# Patient Record
Sex: Female | Born: 1935 | Race: Black or African American | Hispanic: No | Marital: Married | Smoking: Never smoker
Health system: Southern US, Community
[De-identification: ages and names within clinical notes are randomized; demographics above are authoritative.]

## PROBLEM LIST (undated history)

## (undated) DIAGNOSIS — F419 Anxiety disorder, unspecified: Secondary | ICD-10-CM

## (undated) DIAGNOSIS — E7800 Pure hypercholesterolemia, unspecified: Secondary | ICD-10-CM

## (undated) DIAGNOSIS — M109 Gout, unspecified: Secondary | ICD-10-CM

## (undated) DIAGNOSIS — N189 Chronic kidney disease, unspecified: Secondary | ICD-10-CM

## (undated) DIAGNOSIS — I2510 Atherosclerotic heart disease of native coronary artery without angina pectoris: Secondary | ICD-10-CM

## (undated) DIAGNOSIS — C50912 Malignant neoplasm of unspecified site of left female breast: Secondary | ICD-10-CM

## (undated) DIAGNOSIS — E039 Hypothyroidism, unspecified: Secondary | ICD-10-CM

## (undated) DIAGNOSIS — I219 Acute myocardial infarction, unspecified: Secondary | ICD-10-CM

## (undated) DIAGNOSIS — Z9289 Personal history of other medical treatment: Secondary | ICD-10-CM

## (undated) DIAGNOSIS — M1990 Unspecified osteoarthritis, unspecified site: Secondary | ICD-10-CM

## (undated) DIAGNOSIS — I10 Essential (primary) hypertension: Secondary | ICD-10-CM

## (undated) DIAGNOSIS — A159 Respiratory tuberculosis unspecified: Secondary | ICD-10-CM

## (undated) DIAGNOSIS — I82409 Acute embolism and thrombosis of unspecified deep veins of unspecified lower extremity: Secondary | ICD-10-CM

## (undated) HISTORY — PX: ABDOMINAL HYSTERECTOMY: SHX81

## (undated) HISTORY — PX: CATARACT EXTRACTION, BILATERAL: SHX1313

## (undated) HISTORY — PX: BREAST BIOPSY: SHX20

## (undated) HISTORY — PX: LUMBAR DISC SURGERY: SHX700

---

## 2016-06-03 DIAGNOSIS — I219 Acute myocardial infarction, unspecified: Secondary | ICD-10-CM

## 2016-06-03 HISTORY — PX: CORONARY ANGIOPLASTY WITH STENT PLACEMENT: SHX49

## 2016-06-03 HISTORY — DX: Acute myocardial infarction, unspecified: I21.9

## 2016-08-04 DIAGNOSIS — I82409 Acute embolism and thrombosis of unspecified deep veins of unspecified lower extremity: Secondary | ICD-10-CM

## 2016-08-04 HISTORY — DX: Acute embolism and thrombosis of unspecified deep veins of unspecified lower extremity: I82.409

## 2016-09-01 ENCOUNTER — Observation Stay (HOSPITAL_COMMUNITY): Payer: Medicare Other

## 2016-09-01 ENCOUNTER — Other Ambulatory Visit (HOSPITAL_COMMUNITY): Payer: Self-pay

## 2016-09-01 ENCOUNTER — Inpatient Hospital Stay (HOSPITAL_COMMUNITY)
Admission: EM | Admit: 2016-09-01 | Discharge: 2016-09-05 | DRG: 644 | Disposition: A | Discharge status: Home / Self Care | Payer: Medicare Other | Attending: Oncology | Admitting: Oncology

## 2016-09-01 ENCOUNTER — Encounter (HOSPITAL_COMMUNITY): Payer: Self-pay

## 2016-09-01 DIAGNOSIS — Z86718 Personal history of other venous thrombosis and embolism: Secondary | ICD-10-CM

## 2016-09-01 DIAGNOSIS — I240 Acute coronary thrombosis not resulting in myocardial infarction: Secondary | ICD-10-CM | POA: Diagnosis present

## 2016-09-01 DIAGNOSIS — N179 Acute kidney failure, unspecified: Secondary | ICD-10-CM | POA: Diagnosis present

## 2016-09-01 DIAGNOSIS — M533 Sacrococcygeal disorders, not elsewhere classified: Secondary | ICD-10-CM | POA: Diagnosis present

## 2016-09-01 DIAGNOSIS — R531 Weakness: Secondary | ICD-10-CM

## 2016-09-01 DIAGNOSIS — Z9114 Patient's other noncompliance with medication regimen: Secondary | ICD-10-CM

## 2016-09-01 DIAGNOSIS — I440 Atrioventricular block, first degree: Secondary | ICD-10-CM | POA: Diagnosis present

## 2016-09-01 DIAGNOSIS — N185 Chronic kidney disease, stage 5: Secondary | ICD-10-CM | POA: Diagnosis present

## 2016-09-01 DIAGNOSIS — I2510 Atherosclerotic heart disease of native coronary artery without angina pectoris: Secondary | ICD-10-CM | POA: Diagnosis present

## 2016-09-01 DIAGNOSIS — I252 Old myocardial infarction: Secondary | ICD-10-CM

## 2016-09-01 DIAGNOSIS — Z638 Other specified problems related to primary support group: Secondary | ICD-10-CM

## 2016-09-01 DIAGNOSIS — I700 Atherosclerosis of aorta: Secondary | ICD-10-CM

## 2016-09-01 DIAGNOSIS — I480 Paroxysmal atrial fibrillation: Secondary | ICD-10-CM

## 2016-09-01 DIAGNOSIS — I82402 Acute embolism and thrombosis of unspecified deep veins of left lower extremity: Secondary | ICD-10-CM | POA: Diagnosis present

## 2016-09-01 DIAGNOSIS — R9431 Abnormal electrocardiogram [ECG] [EKG]: Secondary | ICD-10-CM | POA: Diagnosis not present

## 2016-09-01 DIAGNOSIS — I120 Hypertensive chronic kidney disease with stage 5 chronic kidney disease or end stage renal disease: Secondary | ICD-10-CM | POA: Diagnosis present

## 2016-09-01 DIAGNOSIS — Z955 Presence of coronary angioplasty implant and graft: Secondary | ICD-10-CM

## 2016-09-01 DIAGNOSIS — I4581 Long QT syndrome: Secondary | ICD-10-CM | POA: Diagnosis present

## 2016-09-01 DIAGNOSIS — D72829 Elevated white blood cell count, unspecified: Secondary | ICD-10-CM | POA: Diagnosis present

## 2016-09-01 DIAGNOSIS — R778 Other specified abnormalities of plasma proteins: Secondary | ICD-10-CM | POA: Diagnosis not present

## 2016-09-01 DIAGNOSIS — R7989 Other specified abnormal findings of blood chemistry: Secondary | ICD-10-CM | POA: Diagnosis not present

## 2016-09-01 DIAGNOSIS — R4182 Altered mental status, unspecified: Secondary | ICD-10-CM | POA: Diagnosis not present

## 2016-09-01 DIAGNOSIS — E039 Hypothyroidism, unspecified: Secondary | ICD-10-CM | POA: Diagnosis not present

## 2016-09-01 DIAGNOSIS — D638 Anemia in other chronic diseases classified elsewhere: Secondary | ICD-10-CM

## 2016-09-01 DIAGNOSIS — Z23 Encounter for immunization: Secondary | ICD-10-CM

## 2016-09-01 DIAGNOSIS — T462X5A Adverse effect of other antidysrhythmic drugs, initial encounter: Secondary | ICD-10-CM | POA: Diagnosis present

## 2016-09-01 DIAGNOSIS — D631 Anemia in chronic kidney disease: Secondary | ICD-10-CM | POA: Diagnosis present

## 2016-09-01 DIAGNOSIS — I82439 Acute embolism and thrombosis of unspecified popliteal vein: Secondary | ICD-10-CM | POA: Diagnosis present

## 2016-09-01 DIAGNOSIS — D649 Anemia, unspecified: Secondary | ICD-10-CM

## 2016-09-01 DIAGNOSIS — Z79899 Other long term (current) drug therapy: Secondary | ICD-10-CM

## 2016-09-01 DIAGNOSIS — Z7901 Long term (current) use of anticoagulants: Secondary | ICD-10-CM

## 2016-09-01 DIAGNOSIS — R410 Disorientation, unspecified: Secondary | ICD-10-CM | POA: Diagnosis present

## 2016-09-01 DIAGNOSIS — Z8611 Personal history of tuberculosis: Secondary | ICD-10-CM

## 2016-09-01 DIAGNOSIS — H18419 Arcus senilis, unspecified eye: Secondary | ICD-10-CM | POA: Diagnosis present

## 2016-09-01 DIAGNOSIS — I82409 Acute embolism and thrombosis of unspecified deep veins of unspecified lower extremity: Secondary | ICD-10-CM

## 2016-09-01 HISTORY — DX: Atherosclerotic heart disease of native coronary artery without angina pectoris: I25.10

## 2016-09-01 HISTORY — DX: Gout, unspecified: M10.9

## 2016-09-01 HISTORY — DX: Personal history of other medical treatment: Z92.89

## 2016-09-01 HISTORY — DX: Pure hypercholesterolemia, unspecified: E78.00

## 2016-09-01 HISTORY — DX: Malignant neoplasm of unspecified site of left female breast: C50.912

## 2016-09-01 HISTORY — DX: Hypothyroidism, unspecified: E03.9

## 2016-09-01 HISTORY — DX: Respiratory tuberculosis unspecified: A15.9

## 2016-09-01 HISTORY — DX: Unspecified osteoarthritis, unspecified site: M19.90

## 2016-09-01 HISTORY — DX: Essential (primary) hypertension: I10

## 2016-09-01 HISTORY — DX: Acute embolism and thrombosis of unspecified deep veins of unspecified lower extremity: I82.409

## 2016-09-01 HISTORY — DX: Acute myocardial infarction, unspecified: I21.9

## 2016-09-01 HISTORY — DX: Anxiety disorder, unspecified: F41.9

## 2016-09-01 HISTORY — DX: Chronic kidney disease, unspecified: N18.9

## 2016-09-01 LAB — CBG MONITORING, ED: Glucose-Capillary: 99 mg/dL (ref 65–99)

## 2016-09-01 LAB — URINE MICROSCOPIC-ADD ON

## 2016-09-01 LAB — URINALYSIS, ROUTINE W REFLEX MICROSCOPIC
BILIRUBIN URINE: NEGATIVE
Glucose, UA: NEGATIVE mg/dL
Ketones, ur: NEGATIVE mg/dL
NITRITE: NEGATIVE
PH: 7.5 (ref 5.0–8.0)
Protein, ur: 30 mg/dL — AB
SPECIFIC GRAVITY, URINE: 1.016 (ref 1.005–1.030)

## 2016-09-01 LAB — BASIC METABOLIC PANEL
ANION GAP: 8 (ref 5–15)
BUN: 26 mg/dL — ABNORMAL HIGH (ref 6–20)
CHLORIDE: 107 mmol/L (ref 101–111)
CO2: 26 mmol/L (ref 22–32)
CREATININE: 3.15 mg/dL — AB (ref 0.44–1.00)
Calcium: 9.1 mg/dL (ref 8.9–10.3)
GFR calc non Af Amer: 12 mL/min — ABNORMAL LOW (ref >60)
GFR, EST AFRICAN AMERICAN: 14 mL/min — AB (ref >60)
Glucose, Bld: 92 mg/dL (ref 65–99)
POTASSIUM: 3.8 mmol/L (ref 3.5–5.1)
SODIUM: 141 mmol/L (ref 135–145)

## 2016-09-01 LAB — TROPONIN I
TROPONIN I: 0.31 ng/mL — AB (ref <0.03)
Troponin I: 0.28 ng/mL (ref <0.03)
Troponin I: 0.26 ng/mL (ref <0.03)

## 2016-09-01 LAB — HEPATIC FUNCTION PANEL
ALT: 34 U/L (ref 14–54)
AST: 67 U/L — ABNORMAL HIGH (ref 15–41)
Albumin: 2.7 g/dL — ABNORMAL LOW (ref 3.5–5.0)
Alkaline Phosphatase: 101 U/L (ref 38–126)
BILIRUBIN DIRECT: 0.3 mg/dL (ref 0.1–0.5)
BILIRUBIN INDIRECT: 0.5 mg/dL (ref 0.3–0.9)
Total Bilirubin: 0.8 mg/dL (ref 0.3–1.2)
Total Protein: 5.5 g/dL — ABNORMAL LOW (ref 6.5–8.1)

## 2016-09-01 LAB — CBC WITH DIFFERENTIAL/PLATELET
BASOS PCT: 0 %
Basophils Absolute: 0 10*3/uL (ref 0.0–0.1)
EOS ABS: 0.3 10*3/uL (ref 0.0–0.7)
Eosinophils Relative: 3 %
HEMATOCRIT: 27.9 % — AB (ref 36.0–46.0)
HEMOGLOBIN: 9.1 g/dL — AB (ref 12.0–15.0)
Lymphocytes Relative: 24 %
Lymphs Abs: 2.3 10*3/uL (ref 0.7–4.0)
MCH: 29.9 pg (ref 26.0–34.0)
MCHC: 32.6 g/dL (ref 30.0–36.0)
MCV: 91.8 fL (ref 78.0–100.0)
MONOS PCT: 5 %
Monocytes Absolute: 0.5 10*3/uL (ref 0.1–1.0)
NEUTROS ABS: 6.4 10*3/uL (ref 1.7–7.7)
NEUTROS PCT: 68 %
Platelets: 249 10*3/uL (ref 150–400)
RBC: 3.04 MIL/uL — AB (ref 3.87–5.11)
RDW: 17.9 % — ABNORMAL HIGH (ref 11.5–15.5)
WBC: 9.5 10*3/uL (ref 4.0–10.5)

## 2016-09-01 LAB — I-STAT TROPONIN, ED: TROPONIN I, POC: 0.31 ng/mL — AB (ref 0.00–0.08)

## 2016-09-01 LAB — MAGNESIUM: MAGNESIUM: 2 mg/dL (ref 1.7–2.4)

## 2016-09-01 LAB — BRAIN NATRIURETIC PEPTIDE: B NATRIURETIC PEPTIDE 5: 810.7 pg/mL — AB (ref 0.0–100.0)

## 2016-09-01 LAB — PROTIME-INR
INR: 1.4
Prothrombin Time: 17.3 seconds — ABNORMAL HIGH (ref 11.4–15.2)

## 2016-09-01 LAB — T4, FREE: FREE T4: 1.04 ng/dL (ref 0.61–1.12)

## 2016-09-01 LAB — HEPARIN LEVEL (UNFRACTIONATED): HEPARIN UNFRACTIONATED: 0.8 [IU]/mL — AB (ref 0.30–0.70)

## 2016-09-01 LAB — I-STAT CG4 LACTIC ACID, ED: Lactic Acid, Venous: 1.78 mmol/L (ref 0.5–1.9)

## 2016-09-01 LAB — TSH: TSH: 32.15 u[IU]/mL — AB (ref 0.350–4.500)

## 2016-09-01 MED ORDER — ASPIRIN EC 81 MG PO TBEC
81.0000 mg | DELAYED_RELEASE_TABLET | Freq: Every day | ORAL | Status: DC
  Administered 2016-09-02: 81 mg via ORAL
  Filled 2016-09-01: qty 1

## 2016-09-01 MED ORDER — ENSURE ENLIVE PO LIQD
237.0000 mL | Freq: Two times a day (BID) | ORAL | Status: DC
  Administered 2016-09-01 – 2016-09-05 (×6): 237 mL via ORAL

## 2016-09-01 MED ORDER — ACETAMINOPHEN 325 MG PO TABS
650.0000 mg | ORAL_TABLET | Freq: Four times a day (QID) | ORAL | Status: DC | PRN
  Administered 2016-09-02 – 2016-09-05 (×8): 650 mg via ORAL
  Filled 2016-09-01 (×8): qty 2

## 2016-09-01 MED ORDER — SODIUM CHLORIDE 0.9 % IV BOLUS (SEPSIS)
500.0000 mL | Freq: Once | INTRAVENOUS | Status: AC
  Administered 2016-09-01: 500 mL via INTRAVENOUS

## 2016-09-01 MED ORDER — CLOPIDOGREL BISULFATE 75 MG PO TABS
75.0000 mg | ORAL_TABLET | Freq: Every day | ORAL | Status: DC
  Administered 2016-09-01 – 2016-09-05 (×5): 75 mg via ORAL
  Filled 2016-09-01 (×5): qty 1

## 2016-09-01 MED ORDER — ACETAMINOPHEN 500 MG PO TABS
1000.0000 mg | ORAL_TABLET | Freq: Once | ORAL | Status: AC
  Administered 2016-09-01: 1000 mg via ORAL
  Filled 2016-09-01: qty 2

## 2016-09-01 MED ORDER — LEVOTHYROXINE SODIUM 25 MCG PO TABS: 25.0000 ug | ORAL_TABLET | Freq: Every day | ORAL | Status: DC

## 2016-09-01 MED ORDER — SODIUM CHLORIDE 0.9 % IV SOLN
INTRAVENOUS | Status: AC
  Administered 2016-09-01 – 2016-09-02 (×2): via INTRAVENOUS

## 2016-09-01 MED ORDER — LORAZEPAM 0.5 MG PO TABS
0.5000 mg | ORAL_TABLET | Freq: Every evening | ORAL | Status: DC | PRN
  Administered 2016-09-01 – 2016-09-03 (×3): 0.5 mg via ORAL
  Filled 2016-09-01 (×3): qty 1

## 2016-09-01 MED ORDER — LEVOTHYROXINE SODIUM 50 MCG PO TABS
50.0000 ug | ORAL_TABLET | Freq: Every day | ORAL | Status: DC
  Administered 2016-09-02 – 2016-09-05 (×4): 50 ug via ORAL
  Filled 2016-09-01: qty 1
  Filled 2016-09-01: qty 2
  Filled 2016-09-01 (×2): qty 1

## 2016-09-01 MED ORDER — INFLUENZA VAC SPLIT QUAD 0.5 ML IM SUSY
0.5000 mL | PREFILLED_SYRINGE | INTRAMUSCULAR | Status: AC
  Administered 2016-09-04: 0.5 mL via INTRAMUSCULAR

## 2016-09-01 MED ORDER — AMIODARONE HCL 200 MG PO TABS
200.0000 mg | ORAL_TABLET | Freq: Every day | ORAL | Status: DC
  Administered 2016-09-01 – 2016-09-03 (×3): 200 mg via ORAL
  Filled 2016-09-01 (×3): qty 1

## 2016-09-01 MED ORDER — LEVOTHYROXINE SODIUM 50 MCG PO TABS: 50.0000 ug | ORAL_TABLET | Freq: Every day | ORAL | Status: DC

## 2016-09-01 MED ORDER — ASPIRIN 81 MG PO CHEW
324.0000 mg | CHEWABLE_TABLET | Freq: Once | ORAL | Status: AC
  Administered 2016-09-01: 324 mg via ORAL
  Filled 2016-09-01: qty 4

## 2016-09-01 MED ORDER — ACETAMINOPHEN 325 MG PO TABS
650.0000 mg | ORAL_TABLET | Freq: Once | ORAL | Status: AC
  Administered 2016-09-01: 650 mg via ORAL
  Filled 2016-09-01: qty 2

## 2016-09-01 MED ORDER — SODIUM CHLORIDE 0.9% FLUSH
3.0000 mL | Freq: Two times a day (BID) | INTRAVENOUS | Status: DC
  Administered 2016-09-02 – 2016-09-04 (×4): 3 mL via INTRAVENOUS

## 2016-09-01 MED ORDER — HEPARIN (PORCINE) IN NACL 100-0.45 UNIT/ML-% IJ SOLN
650.0000 [IU]/h | INTRAMUSCULAR | Status: DC
  Administered 2016-09-01: 950 [IU]/h via INTRAVENOUS
  Filled 2016-09-01 (×2): qty 250

## 2016-09-01 MED ORDER — DILTIAZEM HCL 30 MG PO TABS
30.0000 mg | ORAL_TABLET | Freq: Three times a day (TID) | ORAL | Status: DC
  Administered 2016-09-01 – 2016-09-05 (×12): 30 mg via ORAL
  Filled 2016-09-01 (×12): qty 1
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
## 2016-09-01 NOTE — Progress Notes (Signed)
ANTICOAGULATION CONSULT NOTE - Initial Consult  Pharmacy Consult for heparin  Indication: DVT  No Known Allergies  Patient Measurements: Height: 5\' 6"  (167.6 cm) Weight: 125 lb (56.7 kg) IBW/kg (Calculated) : 59.3 Heparin Dosing Weight: 56.7  Vital Signs: Temp: 97.8 F (36.6 C) (09/29 0817) Temp Source: Oral (09/29 0817) BP: 115/78 (09/29 1047) Pulse Rate: 67 (09/29 1047)  Labs:  Recent Labs  09/01/16 0903  HGB 9.1*  HCT 27.9*  PLT 249  CREATININE 3.15*  TROPONINI 0.28*   Estimated Creatinine Clearance: 11.3 mL/min (by C-G formula based on SCr of 3.15 mg/dL (H)).  Medical History: Past Medical History:  Diagnosis Date  . DVT (deep venous thrombosis) (HCC)    left  . Hypertension   . MI (myocardial infarction) [facility])    Assessment: Dawn Watson presents to [facility] with bilateral lower extremity edema. Pharmacy has been consulted for heparin drip for concert of left lower leg DVT.   Goal of Therapy:  Heparin level 0.3-0.7 units/ml Monitor platelets by anticoagulation protocol: Yes   Plan:  Start heparin infusion at 950 units/hr  8 hour level Monitor s/sx of bleeding  Dawn Watson 09/01/2016,11:35 AM

## 2016-09-01 NOTE — ED Notes (Signed)
RN attempted IV 2x unsuccessfully.  

## 2016-09-01 NOTE — ED Notes (Signed)
Pt returned from X-ray.  

## 2016-09-01 NOTE — Progress Notes (Signed)
ANTICOAGULATION CONSULT NOTE - Follow Up Consult  Pharmacy Consult for Heparin  Indication: DVT  No Known Allergies  Patient Measurements: Height: 5\' 6"  (167.6 cm) Weight: 125 lb (56.7 kg) IBW/kg (Calculated) : 59.3  Vital Signs: Temp: 99.1 F (37.3 C) (09/29 2012) Temp Source: Oral (09/29 2012) BP: 139/68 (09/29 2012) Pulse Rate: 67 (09/29 2012)  Labs:  Recent Labs  09/01/16 0903 09/01/16 1513 09/01/16 2144  HGB 9.1*  --   --   HCT 27.9*  --   --   PLT 249  --   --   LABPROT  --  17.3*  --   INR  --  1.40  --   HEPARINUNFRC  --   --  0.80*  CREATININE 3.15*  --   --   TROPONINI 0.28* 0.26* 0.31*    Estimated Creatinine Clearance: 11.3 mL/min (by C-G formula based on SCr of 3.15 mg/dL (H)).  Assessment: Pt with recent DVT on warfarin, now with concern for PE, on heparin for now, looks like she was on warfarin with a sub-therapeutic INR of 1.4, heparin level tonight is slightly elevated at 0.8, no issues per RN.   Goal of Therapy:  Heparin level 0.3-0.7 units/ml Monitor platelets by anticoagulation protocol: Yes   Plan:  -Decrease heparin to 850 units/hr -HL with AM labs  [name] 09/01/2016,11:18 PM
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
## 2016-09-01 NOTE — ED Notes (Signed)
Attempted IV x2. 

## 2016-09-01 NOTE — ED Notes (Signed)
CBG- 99 

## 2016-09-01 NOTE — ED Notes (Signed)
Provided pt with beef broth to drink while she waits on breakfast tray

## 2016-09-01 NOTE — ED Provider Notes (Signed)
[facility] DEPT Provider Note   CSN: [id number] Arrival date & time:        History   Chief Complaint Chief Complaint  Patient presents with  . Weakness    HPI Dawn Watson is a 82 y.o. female.  The history is provided by the patient.  Weakness  Primary symptoms include memory loss (The patient is only oriented to self and the president). Primary symptoms comment: Patient states she does not want to go home because her niece does not want to feed people in her house and there is too much noise. Chronicity: unknown. Associated symptoms include altered mental status.  Altered Mental Status   Associated symptoms include weakness.    Past Medical History:  Diagnosis Date  . DVT (deep venous thrombosis) (HCC)    left  . Hypertension   . MI (myocardial infarction) ([place])     There are no active problems to display for this patient.   Past Surgical History:  Procedure Laterality Date  . CORONARY ANGIOPLASTY WITH STENT PLACEMENT      OB History    No data available       Home Medications    Prior to Admission medications   Not on File    Family History History reviewed. No pertinent family history.  Social History Social History  Substance Use Topics  . Smoking status: Never Smoker  . Smokeless tobacco: Never Used  . Alcohol use No     Allergies   Review of patient's allergies indicates no known allergies.   Review of Systems Review of Systems  Unable to perform ROS: Mental status change  Musculoskeletal: Positive for back pain.  Neurological: Positive for weakness.  Psychiatric/Behavioral: Positive for memory loss (The patient is only oriented to self and the president).  Level 5 exception to history: dementia  Physical Exam Updated Vital Signs BP 148/67 (BP Location: Right Arm)   Pulse 70   Temp 97.8 F (36.6 C) (Oral)   Resp 16   Ht 5\' 6"  (1.676 m)   Wt 125 lb (56.7 kg)   SpO2 100%   BMI 20.18 kg/m   Physical Exam    Constitutional: She appears well-developed and well-nourished. No distress.  HENT:  Head: Normocephalic and atraumatic.  Nose: Nose normal.  Eyes: Conjunctivae are normal.  Neck: Neck supple. No tracheal deviation present.  Cardiovascular: Normal rate, regular rhythm and normal heart sounds.   Pulmonary/Chest: Effort normal and breath sounds normal. No respiratory distress.  Abdominal: Soft. She exhibits no distension. There is no tenderness.  Neurological: She is alert. She has normal strength. She is disoriented (unclear what Pt's baseline is). No cranial nerve deficit. GCS eye subscore is 4. GCS verbal subscore is 4. GCS motor subscore is 6.  Skin: Skin is warm and dry. Capillary refill takes less than 2 seconds.  Psychiatric: She has a normal mood and affect.    ED Treatments / Results  Labs (all labs ordered are listed, but only abnormal results are displayed) Labs Reviewed  CBC WITH DIFFERENTIAL/PLATELET - Abnormal; Notable for the following:       Result Value   RBC 3.04 (*)    Hemoglobin 9.1 (*)    HCT 27.9 (*)    RDW 17.9 (*)    All other components within normal limits  BASIC METABOLIC PANEL - Abnormal; Notable for the following:    BUN 26 (*)    Creatinine, Ser 3.15 (*)    GFR calc non Af Amer 12 (*)  GFR calc Af Amer 14 (*)    All other components within normal limits  URINALYSIS, ROUTINE W REFLEX MICROSCOPIC (NOT AT [facility]) - Abnormal; Notable for the following:    Hgb urine dipstick MODERATE (*)    Protein, ur 30 (*)    Leukocytes, UA TRACE (*)    All other components within normal limits  URINE MICROSCOPIC-ADD ON - Abnormal; Notable for the following:    Squamous Epithelial / LPF 6-30 (*)    Bacteria, UA FEW (*)    All other components within normal limits  BRAIN NATRIURETIC PEPTIDE - Abnormal; Notable for the following:    B Natriuretic Peptide 810.7 (*)    All other components within normal limits  TSH - Abnormal; Notable for the following:    TSH 32.150  (*)    All other components within normal limits  HEPATIC FUNCTION PANEL - Abnormal; Notable for the following:    Total Protein 5.5 (*)    Albumin 2.7 (*)    AST 67 (*)    All other components within normal limits  I-STAT TROPOININ, ED - Abnormal; Notable for the following:    Troponin i, poc 0.31 (*)    All other components within normal limits  MAGNESIUM  TROPONIN I  TROPONIN I  TROPONIN I  CBG MONITORING, ED  I-STAT CG4 LACTIC ACID, ED    EKG  EKG Interpretation  Date/Time:  Friday September 01 2016 08:18:28 EDT Ventricular Rate:  69 PR Interval:    QRS Duration: 108 QT Interval:  534 QTC Calculation: 573 R Axis:   -66 Text Interpretation:  Sinus or ectopic atrial rhythm Prolonged PR interval Inferior infarct, age indeterminate Abnormal lateral Q waves Probable anterior infarct, recent Prolonged QT interval No previous tracing Confirmed by Harish Bram MD, Shermon Bozzi [phone]) on 09/01/2016 8:30:02 AM       Radiology Dg Chest 2 View  Result Date: 09/01/2016 CLINICAL DATA:  82 year old with weakness. History of myocardial infarction, hypertension and DVT. No relevant surgical history provided. EXAM: CHEST  2 VIEW COMPARISON:  None. FINDINGS: The heart is enlarged. There is a coronary artery stent and aortic atherosclerosis. There is suspected chronic lung disease with interstitial prominence, central airway thickening and volume loss in the left upper lobe. There is asymmetric left apical pleural and subpleural density, likely the sequela of prior granulomatous disease. Some of these findings could be postsurgical. The patient appears to be status post partial resection of the fifth ribs bilaterally. No acute osseous findings are seen. IMPRESSION: No suspected acute findings. Evaluation limited by the lack of prior studies. There are apparent postsurgical changes status post partial resection of the fifth ribs bilaterally. Cardiomegaly and probable sequela of granulomatous disease in the  left upper lobe. Correlate clinically. Electronically Signed   By: Richardean Sale M.D.   On: 09/01/2016 10:44    Procedures Procedures (including critical care time)  Medications Ordered in ED Medications  acetaminophen (TYLENOL) tablet 1,000 mg (1,000 mg Oral Given 09/01/16 0932)  aspirin chewable tablet 324 mg (324 mg Oral Given 09/01/16 0932)  sodium chloride 0.9 % bolus 500 mL (500 mLs Intravenous New Bag/Given 09/01/16 1012)     Initial Impression / Assessment and Plan / ED Course  I have reviewed the triage vital signs and the nursing notes.  Pertinent labs & imaging results that were available during my care of the patient were reviewed by me and considered in my medical decision making (see chart for details).  Clinical Course  82 y.o. female presents with EMS for unknown complaint. Patient has no medical history documented at this facility. Patient is noncontributory historian and is only oriented to self and who the president is. She is in no acute distress and has no complaints besides mild low back pain. She is unsure why she came to the emergency department and changes her story multiple times. Multiple lab abnormalities are concerning including signs of acute or chronic kidney disease, anemia, elevation of troponin, hypothyroid, and unable to determine the onset or any historical features.  Cardiology consulted and recommends no intervention. Internal medicine was consulted for admission and will see the patient in the emergency department.   Final Clinical Impressions(s) / ED Diagnoses   Final diagnoses:  Weakness  Troponin level elevated  Confusion  Hypothyroidism, unspecified hypothyroidism type  Elevated serum creatinine  Normocytic anemia    New Prescriptions New Prescriptions   No medications on file     Leo Grosser, MD 09/01/16 1104
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
## 2016-09-01 NOTE — ED Notes (Signed)
Admitting at bedside 

## 2016-09-01 NOTE — ED Notes (Signed)
Pt being taken to Xray by Geoffery Spruce

## 2016-09-02 ENCOUNTER — Observation Stay (HOSPITAL_COMMUNITY): Payer: Medicare Other

## 2016-09-02 DIAGNOSIS — Z955 Presence of coronary angioplasty implant and graft: Secondary | ICD-10-CM | POA: Diagnosis not present

## 2016-09-02 DIAGNOSIS — D631 Anemia in chronic kidney disease: Secondary | ICD-10-CM | POA: Diagnosis present

## 2016-09-02 DIAGNOSIS — R410 Disorientation, unspecified: Secondary | ICD-10-CM | POA: Diagnosis present

## 2016-09-02 DIAGNOSIS — H18419 Arcus senilis, unspecified eye: Secondary | ICD-10-CM | POA: Diagnosis present

## 2016-09-02 DIAGNOSIS — I252 Old myocardial infarction: Secondary | ICD-10-CM | POA: Diagnosis not present

## 2016-09-02 DIAGNOSIS — N185 Chronic kidney disease, stage 5: Secondary | ICD-10-CM | POA: Diagnosis present

## 2016-09-02 DIAGNOSIS — I4581 Long QT syndrome: Secondary | ICD-10-CM | POA: Diagnosis present

## 2016-09-02 DIAGNOSIS — R609 Edema, unspecified: Secondary | ICD-10-CM | POA: Diagnosis not present

## 2016-09-02 DIAGNOSIS — Z23 Encounter for immunization: Secondary | ICD-10-CM | POA: Diagnosis not present

## 2016-09-02 DIAGNOSIS — I480 Paroxysmal atrial fibrillation: Secondary | ICD-10-CM | POA: Diagnosis present

## 2016-09-02 DIAGNOSIS — T462X5A Adverse effect of other antidysrhythmic drugs, initial encounter: Secondary | ICD-10-CM | POA: Diagnosis present

## 2016-09-02 DIAGNOSIS — R079 Chest pain, unspecified: Secondary | ICD-10-CM | POA: Diagnosis not present

## 2016-09-02 DIAGNOSIS — R748 Abnormal levels of other serum enzymes: Secondary | ICD-10-CM | POA: Diagnosis not present

## 2016-09-02 DIAGNOSIS — I440 Atrioventricular block, first degree: Secondary | ICD-10-CM | POA: Diagnosis present

## 2016-09-02 DIAGNOSIS — I120 Hypertensive chronic kidney disease with stage 5 chronic kidney disease or end stage renal disease: Secondary | ICD-10-CM | POA: Diagnosis present

## 2016-09-02 DIAGNOSIS — Z86718 Personal history of other venous thrombosis and embolism: Secondary | ICD-10-CM | POA: Diagnosis not present

## 2016-09-02 DIAGNOSIS — I82402 Acute embolism and thrombosis of unspecified deep veins of left lower extremity: Secondary | ICD-10-CM | POA: Diagnosis present

## 2016-09-02 DIAGNOSIS — N179 Acute kidney failure, unspecified: Secondary | ICD-10-CM | POA: Diagnosis present

## 2016-09-02 DIAGNOSIS — R778 Other specified abnormalities of plasma proteins: Secondary | ICD-10-CM | POA: Diagnosis present

## 2016-09-02 DIAGNOSIS — Z8611 Personal history of tuberculosis: Secondary | ICD-10-CM | POA: Diagnosis not present

## 2016-09-02 DIAGNOSIS — Z9114 Patient's other noncompliance with medication regimen: Secondary | ICD-10-CM | POA: Diagnosis not present

## 2016-09-02 DIAGNOSIS — I82439 Acute embolism and thrombosis of unspecified popliteal vein: Secondary | ICD-10-CM | POA: Diagnosis present

## 2016-09-02 DIAGNOSIS — E039 Hypothyroidism, unspecified: Secondary | ICD-10-CM | POA: Diagnosis present

## 2016-09-02 DIAGNOSIS — R9431 Abnormal electrocardiogram [ECG] [EKG]: Secondary | ICD-10-CM | POA: Diagnosis not present

## 2016-09-02 DIAGNOSIS — M533 Sacrococcygeal disorders, not elsewhere classified: Secondary | ICD-10-CM | POA: Diagnosis present

## 2016-09-02 DIAGNOSIS — Z79899 Other long term (current) drug therapy: Secondary | ICD-10-CM | POA: Diagnosis not present

## 2016-09-02 DIAGNOSIS — I240 Acute coronary thrombosis not resulting in myocardial infarction: Secondary | ICD-10-CM | POA: Diagnosis present

## 2016-09-02 DIAGNOSIS — I2510 Atherosclerotic heart disease of native coronary artery without angina pectoris: Secondary | ICD-10-CM | POA: Diagnosis present

## 2016-09-02 DIAGNOSIS — N189 Chronic kidney disease, unspecified: Secondary | ICD-10-CM

## 2016-09-02 DIAGNOSIS — N184 Chronic kidney disease, stage 4 (severe): Secondary | ICD-10-CM | POA: Diagnosis not present

## 2016-09-02 DIAGNOSIS — R4182 Altered mental status, unspecified: Secondary | ICD-10-CM | POA: Diagnosis not present

## 2016-09-02 LAB — BASIC METABOLIC PANEL
ANION GAP: 9 (ref 5–15)
BUN: 24 mg/dL — ABNORMAL HIGH (ref 6–20)
CHLORIDE: 109 mmol/L (ref 101–111)
CO2: 22 mmol/L (ref 22–32)
Calcium: 8.9 mg/dL (ref 8.9–10.3)
Creatinine, Ser: 2.63 mg/dL — ABNORMAL HIGH (ref 0.44–1.00)
GFR calc non Af Amer: 15 mL/min — ABNORMAL LOW (ref >60)
GFR, EST AFRICAN AMERICAN: 18 mL/min — AB (ref >60)
Glucose, Bld: 78 mg/dL (ref 65–99)
POTASSIUM: 3.9 mmol/L (ref 3.5–5.1)
SODIUM: 140 mmol/L (ref 135–145)

## 2016-09-02 LAB — HEPARIN LEVEL (UNFRACTIONATED)
HEPARIN UNFRACTIONATED: 0.96 [IU]/mL — AB (ref 0.30–0.70)
HEPARIN UNFRACTIONATED: 0.76 [IU]/mL — AB (ref 0.30–0.70)

## 2016-09-02 LAB — CBC
HEMATOCRIT: 30.7 % — AB (ref 36.0–46.0)
HEMOGLOBIN: 9.9 g/dL — AB (ref 12.0–15.0)
MCH: 29.5 pg (ref 26.0–34.0)
MCHC: 32.2 g/dL (ref 30.0–36.0)
MCV: 91.4 fL (ref 78.0–100.0)
Platelets: 280 10*3/uL (ref 150–400)
RBC: 3.36 MIL/uL — AB (ref 3.87–5.11)
RDW: 17.8 % — ABNORMAL HIGH (ref 11.5–15.5)
WBC: 11.2 10*3/uL — AB (ref 4.0–10.5)

## 2016-09-02 LAB — TROPONIN I: TROPONIN I: 0.28 ng/mL — AB (ref <0.03)

## 2016-09-02 LAB — T3, FREE: T3 FREE: 1.3 pg/mL — AB (ref 2.0–4.4)

## 2016-09-02 MED ORDER — ENOXAPARIN SODIUM 60 MG/0.6ML ~~LOC~~ SOLN
1.0000 mg/kg | SUBCUTANEOUS | Status: DC
  Administered 2016-09-02: 55 mg via SUBCUTANEOUS
  Filled 2016-09-02: qty 0.6

## 2016-09-02 MED ORDER — MAGNESIUM HYDROXIDE 400 MG/5ML PO SUSP
15.0000 mL | Freq: Every day | ORAL | Status: DC | PRN
  Administered 2016-09-02: 15 mL via ORAL
  Filled 2016-09-02: qty 30

## 2016-09-02 MED ORDER — WARFARIN - PHARMACIST DOSING INPATIENT
Freq: Every day | Status: DC
Start: 2016-09-02 — End: 2016-09-05

## 2016-09-02 MED ORDER — WARFARIN SODIUM 2.5 MG PO TABS
2.5000 mg | ORAL_TABLET | Freq: Once | ORAL | Status: AC
Start: 2016-09-02 — End: 2016-09-02
  Administered 2016-09-02: 2.5 mg via ORAL
  Filled 2016-09-02: qty 1

## 2016-09-02 MED ORDER — DIPHENHYDRAMINE HCL 25 MG PO CAPS
25.0000 mg | ORAL_CAPSULE | Freq: Four times a day (QID) | ORAL | Status: DC | PRN
  Administered 2016-09-02 – 2016-09-05 (×5): 25 mg via ORAL
  Filled 2016-09-02 (×6): qty 1

## 2016-09-02 MED ORDER — OXYCODONE HCL 5 MG PO TABS
5.0000 mg | ORAL_TABLET | Freq: Once | ORAL | Status: AC
Start: 2016-09-02 — End: 2016-09-02
  Administered 2016-09-02: 5 mg via ORAL
  Filled 2016-09-02: qty 1
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
## 2016-09-02 NOTE — Progress Notes (Signed)
Patient Name: Dawn Watson Date of Encounter: 09/02/2016  Hospital Problem List     Principal Problem:   Altered mental status Active Problems:   Troponin level elevated   Abnormal EKG   CAD in native artery, hx of stents   Hypothyroid   CKD (chronic kidney disease) stage 5, GFR less than 15 ml/min [facility])    Patient Profile     82 yr old female with recent DVT, ulcers on buttocks, HTN, kidney disease, and hx of MI with stent to coronary artery.  This per pt was > 2 years ago in [place].   She is on amiodarone.  It is not clear that she is taking anticoagulation.   INR was mildly elevated.    We were asked to see secondary to elevated troponin.   Subjective   She is complaining of buttocks pain.  No acute SOB.  Inpatient Medications    . amiodarone  200 mg Oral Daily  . aspirin EC  81 mg Oral Daily  . clopidogrel  75 mg Oral Daily  . diltiazem  30 mg Oral TID  . feeding supplement (ENSURE ENLIVE)  237 mL Oral BID BM  . Influenza vac split quadrivalent PF  0.5 mL Intramuscular Tomorrow-1000  . levothyroxine  50 mcg Oral QAC breakfast  . sodium chloride flush  3 mL Intravenous Q12H    Vital Signs    Vitals:   09/01/16 1335 09/01/16 1336 09/01/16 2012 09/02/16 0457  BP:  (!) 159/70 139/68 125/88  Pulse:   67 62  Resp:  16 15 (!) 22  Temp: 98.4 F (36.9 C)  99.1 F (37.3 C) 98.3 F (36.8 C)  TempSrc: Oral  Oral Oral  SpO2: 100%  98% 100%  Weight:    124 lb 12.8 oz (56.6 kg)  Height:        Intake/Output Summary (Last 24 hours) at 09/02/16 0900 Last data filed at 09/01/16 1800  Gross per 24 hour  Intake              585 ml  Output                0 ml  Net              585 ml   Filed Weights   09/01/16 0819 09/02/16 0457  Weight: 125 lb (56.7 kg) 124 lb 12.8 oz (56.6 kg)    Physical Exam    GEN: Well nourished, well developed, in mild distress.  Neck: Supple, no JVD, carotid bruits, or masses. Cardiac: RRR, no rubs, or gallops. No clubbing,  cyanosis, trace edema.  Radials/DP/PT 2+ and equal bilaterally.  Respiratory:  Respirations  regular and unlabored, clear to auscultation bilaterally. GI: Soft, nontender, nondistended, BS + x 4. Neuro:  Strength and sensation are intact.   Labs    CBC  Recent Labs  09/01/16 0903 09/02/16 0327  WBC 9.5 11.2*  NEUTROABS 6.4  --   HGB 9.1* 9.9*  HCT 27.9* 30.7*  MCV 91.8 91.4  PLT 249 [id number]   Basic Metabolic Panel  Recent Labs  09/01/16 0903 09/01/16 0928 09/02/16 0327  NA 141  --  140  K 3.8  --  3.9  CL 107  --  109  CO2 26  --  22  GLUCOSE 92  --  78  BUN 26*  --  24*  CREATININE 3.15*  --  2.63*  CALCIUM 9.1  --  8.9  MG  --  2.0  --    Liver Function Tests  Recent Labs  09/01/16 0928  AST 67*  ALT 34  ALKPHOS 101  BILITOT 0.8  PROT 5.5*  ALBUMIN 2.7*   No results for input(s): LIPASE, AMYLASE in the last 72 hours. Cardiac Enzymes  Recent Labs  09/01/16 0903 09/01/16 1513 09/01/16 2144  TROPONINI 0.28* 0.26* 0.31*   BNP Invalid input(s): POCBNP D-Dimer No results for input(s): DDIMER in the last 72 hours. Hemoglobin A1C No results for input(s): HGBA1C in the last 72 hours. Fasting Lipid Panel No results for input(s): CHOL, HDL, LDLCALC, TRIG, CHOLHDL, LDLDIRECT in the last 72 hours. Thyroid Function Tests  Recent Labs  09/01/16 0928 09/01/16 1513  TSH 32.150*  --   T3FREE  --  1.3*    Telemetry    NSR.   ECG    NA  Radiology    Dg Chest 2 View  Result Date: 09/01/2016 CLINICAL DATA:  82 year old with weakness. History of myocardial infarction, hypertension and DVT. No relevant surgical history provided. EXAM: CHEST  2 VIEW COMPARISON:  None. FINDINGS: The heart is enlarged. There is a coronary artery stent and aortic atherosclerosis. There is suspected chronic lung disease with interstitial prominence, central airway thickening and volume loss in the left upper lobe. There is asymmetric left apical pleural and subpleural  density, likely the sequela of prior granulomatous disease. Some of these findings could be postsurgical. The patient appears to be status post partial resection of the fifth ribs bilaterally. No acute osseous findings are seen. IMPRESSION: No suspected acute findings. Evaluation limited by the lack of prior studies. There are apparent postsurgical changes status post partial resection of the fifth ribs bilaterally. Cardiomegaly and probable sequela of granulomatous disease in the left upper lobe. Correlate clinically. Electronically Signed   By: Richardean Sale M.D.   On: 09/01/2016 10:44    Assessment & Plan    PROLONGED QTC:  Likely secondary to amiodarone.  Amiodarone induced QT prolongation rarely leads to torasades.  Still need to understand the reason for amiodarone.    CAD:   Troponin trend is flat and non diagnostic in this setting.  She has no acute cardiac complaints.  Echo results pending.   AMIODARONE:   We can try to obtain records from [facility] to understand why she is on this and her history of CAD.    Signed, Minus Breeding, MD  09/02/2016, 9:00 AM

## 2016-09-02 NOTE — Progress Notes (Signed)
ANTICOAGULATION CONSULT NOTE - Follow Up Consult  Pharmacy Consult for Heparin  Indication: DVT  No Known Allergies  Patient Measurements: Height: 5\' 6"  (167.6 cm) Weight: 124 lb 12.8 oz (56.6 kg) IBW/kg (Calculated) : 59.3  Vital Signs: Temp: 98.3 F (36.8 C) (09/30 0457) Temp Source: Oral (09/30 0457) BP: 125/88 (09/30 0457) Pulse Rate: 62 (09/30 0457)  Labs:  Recent Labs  09/01/16 0903 09/01/16 1513 09/01/16 2144 09/02/16 0327  HGB 9.1*  --   --  9.9*  HCT 27.9*  --   --  30.7*  PLT 249  --   --  280  LABPROT  --  17.3*  --   --   INR  --  1.40  --   --   HEPARINUNFRC  --   --  0.80* 0.96*  CREATININE 3.15*  --   --  2.63*  TROPONINI 0.28* 0.26* 0.31*  --     Estimated Creatinine Clearance: 13.5 mL/min (by C-G formula based on SCr of 2.63 mg/dL (H)).  Assessment: Pt with recent DVT on warfarin, now with concern for PE, on heparin for now, looks like she was on warfarin with a sub-therapeutic INR of 1.4, heparin level this AM remains elevated despite rate decrease  ###Pt also lost IV access after this level so we are waiting for new access to be placed#####  Goal of Therapy:  Heparin level 0.3-0.7 units/ml Monitor platelets by anticoagulation protocol: Yes   Plan:  -Re-start heparin at 750 units/hr when IV access is re-established  -F/U when re-started for correct timing of heparin level   Dawn Watson 09/02/2016,6:04 AM

## 2016-09-02 NOTE — Progress Notes (Signed)
Nutrition Brief Note  Patient identified on the Malnutrition Screening Tool (MST) Report  Wt Readings from Last 15 Encounters:  09/02/16 124 lb 12.8 oz (56.6 kg)    Body mass index is 20.14 kg/m. Patient meets criteria for Regular/normal Ht/wt based on current BMI.   Pt herself is very anxious and reports severe pain from her bottom. She has a healed sacral wound, but that is all that is noted by RN. She claims this is so painful she cant sleep and was asking for something to sleep on or pain medication.   Despite this, pt reports PTA she had a good appetite and was eating 3 meals a day. She reports her UBW is 125 lbs.   She reports some mild constipation. Denies n/v/d. Really her only complaint was her bottom pain. RN is aware of this.   Current diet order is Regular, patient is consuming approximately 50% of meals at this time. Labs and medications reviewed.   No nutrition interventions warranted at this time. If nutrition issues arise, please consult RD.   Burtis Junes RD, LDN, CNSC Clinical Nutrition Pager: [phone] 09/02/2016 6:19 PM

## 2016-09-02 NOTE — Progress Notes (Addendum)
ANTICOAGULATION CONSULT NOTE - Follow Up Consult  Pharmacy Consult for therapeutic Lovenox and Warfarin Indication: DVT  No Known Allergies  Patient Measurements: Height: 5\' 6"  (167.6 cm) Weight: 124 lb 12.8 oz (56.6 kg) IBW/kg (Calculated) : 59.3  Heparin dosing weight: 57 kg   Vital Signs: Temp: 98.8 F (37.1 C) (09/30 1345) Temp Source: Oral (09/30 1345) BP: 139/59 (09/30 1345) Pulse Rate: 66 (09/30 1345)  Labs:  Recent Labs  09/01/16 0903 09/01/16 1513 09/01/16 2144 09/02/16 0327 09/02/16 0643 09/02/16 1353  HGB 9.1*  --   --  9.9*  --   --   HCT 27.9*  --   --  30.7*  --   --   PLT 249  --   --  280  --   --   LABPROT  --  17.3*  --   --   --   --   INR  --  1.40  --   --   --   --   HEPARINUNFRC  --   --  0.80* 0.96*  --  0.76*  CREATININE 3.15*  --   --  2.63*  --   --   TROPONINI 0.28* 0.26* 0.31*  --  0.28*  --     Estimated Creatinine Clearance: 13.5 mL/min (by C-G formula based on SCr of 2.63 mg/dL (H)).  Assessment: 16 YOF with bilateral lower extremity edema and recent DVT on warfarin PTA. Notes indicate concern for PE - no CTA. Heparin levels have remained high despite decrease in gtt rate. HgB low but stable at 9.1 and platelets normal at 280. INR 1.4 on admission 9/29. Warfarin remains on hold. Per nursing, no new signs or symptoms of bleeding noted.   Goal of Therapy:  Heparin level 0.3-0.7 units/ml Monitor platelets by anticoagulation protocol: Yes   Plan:  Decrease heparin gtt to 650 units/hr  Heparin level in 8 hours Daily heparin level and CBC Monitor for signs and symptoms of bleeding   ADDENDUM:  Heparin gtt discontinued  Lovenox 1mg /kg SQ q24 hr Warfarin 2.5 mg x1 today  INR daily  CBC at least every three days  Monitor for s/s bleeding   Argie Ramming, PharmD Pharmacy Resident  Pager [phone] 09/02/16 2:53 PM

## 2016-09-02 NOTE — Progress Notes (Signed)
Notified for IV Restart. Patient up in chair. Chris RN notified to reenter consult when patient is back in bed.

## 2016-09-02 NOTE — Progress Notes (Signed)
Spoke with pharmacist, Phillis Knack regarding heparin infusion having been stopped x 1 hour. AM heparin level will likely reflect this. Will notify pharmacy when IV access is re-established.  Continuing to monitor.

## 2016-09-02 NOTE — Progress Notes (Signed)
Unable to establish IV access after two attempts. Patient agreed to return to bed for IV team restart.

## 2016-09-02 NOTE — Progress Notes (Signed)
VASCULAR LAB PRELIMINARY  PRELIMINARY  PRELIMINARY  PRELIMINARY  Bilateral lower extremity venous duplex completed.    Preliminary report:  There is acute DVT noted in the left popliteal vein.    Called results to [place], RN  Tyanne Derocher, [place], RVT 09/02/2016, 3:24 PM

## 2016-09-03 ENCOUNTER — Inpatient Hospital Stay (HOSPITAL_COMMUNITY): Payer: Medicare Other

## 2016-09-03 DIAGNOSIS — R079 Chest pain, unspecified: Secondary | ICD-10-CM

## 2016-09-03 DIAGNOSIS — R9431 Abnormal electrocardiogram [ECG] [EKG]: Secondary | ICD-10-CM

## 2016-09-03 DIAGNOSIS — N184 Chronic kidney disease, stage 4 (severe): Secondary | ICD-10-CM

## 2016-09-03 LAB — CBC
HCT: 28 % — ABNORMAL LOW (ref 36.0–46.0)
HEMOGLOBIN: 9.2 g/dL — AB (ref 12.0–15.0)
MCH: 30.1 pg (ref 26.0–34.0)
MCHC: 32.9 g/dL (ref 30.0–36.0)
MCV: 91.5 fL (ref 78.0–100.0)
Platelets: 262 10*3/uL (ref 150–400)
RBC: 3.06 MIL/uL — AB (ref 3.87–5.11)
RDW: 17.7 % — ABNORMAL HIGH (ref 11.5–15.5)
WBC: 11.2 10*3/uL — AB (ref 4.0–10.5)

## 2016-09-03 LAB — BASIC METABOLIC PANEL
ANION GAP: 7 (ref 5–15)
BUN: 22 mg/dL — ABNORMAL HIGH (ref 6–20)
CALCIUM: 9 mg/dL (ref 8.9–10.3)
CO2: 26 mmol/L (ref 22–32)
Chloride: 109 mmol/L (ref 101–111)
Creatinine, Ser: 2.72 mg/dL — ABNORMAL HIGH (ref 0.44–1.00)
GFR calc non Af Amer: 15 mL/min — ABNORMAL LOW (ref >60)
GFR, EST AFRICAN AMERICAN: 17 mL/min — AB (ref >60)
Glucose, Bld: 83 mg/dL (ref 65–99)
Potassium: 4 mmol/L (ref 3.5–5.1)
SODIUM: 142 mmol/L (ref 135–145)

## 2016-09-03 LAB — PROTIME-INR
INR: 1.46
PROTHROMBIN TIME: 17.8 s — AB (ref 11.4–15.2)

## 2016-09-03 LAB — ECHOCARDIOGRAM COMPLETE
HEIGHTINCHES: 66 in
WEIGHTICAEL: 1771.2 [oz_av]

## 2016-09-03 MED ORDER — ENOXAPARIN SODIUM 60 MG/0.6ML ~~LOC~~ SOLN
1.0000 mg/kg | SUBCUTANEOUS | Status: DC
  Administered 2016-09-03 – 2016-09-04 (×2): 50 mg via SUBCUTANEOUS
  Filled 2016-09-03 (×2): qty 0.6

## 2016-09-03 MED ORDER — WARFARIN SODIUM 2.5 MG PO TABS
2.5000 mg | ORAL_TABLET | Freq: Once | ORAL | Status: AC
  Administered 2016-09-03: 2.5 mg via ORAL
  Filled 2016-09-03: qty 1

## 2016-09-03 MED ORDER — DICLOFENAC SODIUM 1 % TD GEL
2.0000 g | Freq: Four times a day (QID) | TRANSDERMAL | Status: DC
  Administered 2016-09-03 – 2016-09-05 (×9): 2 g via TOPICAL
  Filled 2016-09-03: qty 100

## 2016-09-03 NOTE — Progress Notes (Signed)
   DVT noted.  Plan per primary team.  No acute cardiac issues.  Echo is still pending.  No outside records to review.   I have talked with nursing to try to have outside cardiology records sent to [place].  They will also check on the status of the echo.  We will see again in the AM.

## 2016-09-03 NOTE — Progress Notes (Signed)
Pt continuously complains of pain in her sacral area. No open areas. Appears to have a healed old sacral ulcer. RN attempted to place sacral foam dressing but patient refused. RN attempting to keep patient off her sacrum in the bed or chair but patient keeps rolling off her sides on to her back or shifting in the chair. RN continuing to monitor.
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
## 2016-09-03 NOTE — Progress Notes (Signed)
  Echocardiogram 2D Echocardiogram has been performed.  Darlina Sicilian M 09/03/2016, 3:48 PM

## 2016-09-03 NOTE — Progress Notes (Signed)
ANTICOAGULATION CONSULT NOTE - Follow Up Consult  Pharmacy Consult for therapeutic Lovenox and Warfarin Indication: DVT  No Known Allergies  Patient Measurements: Height: 5\' 6"  (167.6 cm) Weight: 110 lb 11.2 oz (50.2 kg) IBW/kg (Calculated) : 59.3  Heparin dosing weight: 57 kg   Vital Signs: Temp: 98.4 F (36.9 C) (10/01 0400) BP: 151/67 (10/01 0400) Pulse Rate: 73 (10/01 0400)  Labs:  Recent Labs  09/01/16 0903 09/01/16 1513 09/01/16 2144 09/02/16 0327 09/02/16 0643 09/02/16 1353 09/03/16 0445  HGB 9.1*  --   --  9.9*  --   --  9.2*  HCT 27.9*  --   --  30.7*  --   --  28.0*  PLT 249  --   --  280  --   --  262  LABPROT  --  17.3*  --   --   --   --  17.8*  INR  --  1.40  --   --   --   --  1.46  HEPARINUNFRC  --   --  0.80* 0.96*  --  0.76*  --   CREATININE 3.15*  --   --  2.63*  --   --  2.72*  TROPONINI 0.28* 0.26* 0.31*  --  0.28*  --   --     Estimated Creatinine Clearance: 11.5 mL/min (by C-G formula based on SCr of 2.72 mg/dL (H)).  Assessment: 83 YOF with bilateral lower extremity edema and recent DVT on warfarin PTA. Transitioned from heparin gtt to lovenox + warfarin. DVT confirmed by dopplers in left lower extremity on 9/30. Hgb and Hct downtrending slightly. Platelets within normal limits. INR subtherapeutic at 1.46 for goal of 2-3. No signs or symptoms of bleeding noted.   -Home warfarin dose: 1mg  daily   Goal of Therapy:  INR 2-3 Monitor platelets by anticoagulation protocol: Yes   Plan:  Lovenox 1mg /kg SQ q24 hr - dosed for poor renal function  Warfarin 2.5 mg x1 today  INR daily  CBC at least every three days  Monitor for signs and symptoms of bleeding   Argie Ramming, PharmD Pharmacy Resident  Pager [phone] 09/03/16 6:58 AM

## 2016-09-04 DIAGNOSIS — I480 Paroxysmal atrial fibrillation: Secondary | ICD-10-CM

## 2016-09-04 DIAGNOSIS — Z955 Presence of coronary angioplasty implant and graft: Secondary | ICD-10-CM

## 2016-09-04 DIAGNOSIS — R748 Abnormal levels of other serum enzymes: Secondary | ICD-10-CM

## 2016-09-04 DIAGNOSIS — D638 Anemia in other chronic diseases classified elsewhere: Secondary | ICD-10-CM

## 2016-09-04 DIAGNOSIS — I2510 Atherosclerotic heart disease of native coronary artery without angina pectoris: Secondary | ICD-10-CM

## 2016-09-04 DIAGNOSIS — I82432 Acute embolism and thrombosis of left popliteal vein: Secondary | ICD-10-CM

## 2016-09-04 DIAGNOSIS — N185 Chronic kidney disease, stage 5: Secondary | ICD-10-CM

## 2016-09-04 DIAGNOSIS — M533 Sacrococcygeal disorders, not elsewhere classified: Secondary | ICD-10-CM

## 2016-09-04 LAB — RENAL FUNCTION PANEL
ALBUMIN: 2.6 g/dL — AB (ref 3.5–5.0)
ANION GAP: 7 (ref 5–15)
BUN: 20 mg/dL (ref 6–20)
CALCIUM: 8.8 mg/dL — AB (ref 8.9–10.3)
CO2: 23 mmol/L (ref 22–32)
Chloride: 110 mmol/L (ref 101–111)
Creatinine, Ser: 2.51 mg/dL — ABNORMAL HIGH (ref 0.44–1.00)
GFR, EST AFRICAN AMERICAN: 19 mL/min — AB (ref >60)
GFR, EST NON AFRICAN AMERICAN: 16 mL/min — AB (ref >60)
Glucose, Bld: 85 mg/dL (ref 65–99)
PHOSPHORUS: 2.8 mg/dL (ref 2.5–4.6)
POTASSIUM: 3.9 mmol/L (ref 3.5–5.1)
SODIUM: 140 mmol/L (ref 135–145)

## 2016-09-04 LAB — FERRITIN: Ferritin: 2953 ng/mL — ABNORMAL HIGH (ref 11–307)

## 2016-09-04 LAB — PROTIME-INR
INR: 1.49
PROTHROMBIN TIME: 18.2 s — AB (ref 11.4–15.2)

## 2016-09-04 LAB — CBC
HCT: 25.8 % — ABNORMAL LOW (ref 36.0–46.0)
HEMOGLOBIN: 8.4 g/dL — AB (ref 12.0–15.0)
MCH: 29.7 pg (ref 26.0–34.0)
MCHC: 32.6 g/dL (ref 30.0–36.0)
MCV: 91.2 fL (ref 78.0–100.0)
Platelets: 233 10*3/uL (ref 150–400)
RBC: 2.83 MIL/uL — AB (ref 3.87–5.11)
RDW: 17.7 % — ABNORMAL HIGH (ref 11.5–15.5)
WBC: 9.5 10*3/uL (ref 4.0–10.5)

## 2016-09-04 LAB — GLUCOSE, CAPILLARY: Glucose-Capillary: 88 mg/dL (ref 65–99)

## 2016-09-04 LAB — IRON AND TIBC
IRON: 51 ug/dL (ref 28–170)
SATURATION RATIOS: 39 % — AB (ref 10.4–31.8)
TIBC: 130 ug/dL — AB (ref 250–450)
UIBC: 79 ug/dL

## 2016-09-04 MED ORDER — ACETAMINOPHEN 325 MG PO TABS
650.0000 mg | ORAL_TABLET | Freq: Once | ORAL | Status: AC
  Administered 2016-09-04: 650 mg via ORAL
  Filled 2016-09-04: qty 2

## 2016-09-04 MED ORDER — WARFARIN SODIUM 2.5 MG PO TABS
2.5000 mg | ORAL_TABLET | Freq: Once | ORAL | Status: AC
  Administered 2016-09-04: 2.5 mg via ORAL
  Filled 2016-09-04: qty 1

## 2016-09-04 MED ORDER — AMIODARONE HCL 100 MG PO TABS
100.0000 mg | ORAL_TABLET | Freq: Every day | ORAL | Status: DC
  Administered 2016-09-05: 100 mg via ORAL
  Filled 2016-09-04: qty 1

## 2016-09-04 NOTE — Progress Notes (Signed)
   09/04/16 1010  Clinical Encounter Type  Visited With Patient  Visit Type Spiritual support  Referral From Other (Comment) (Consult)  Consult/Referral To Chaplain  Spiritual Encounters  Spiritual Needs Prayer;Emotional  Stress Factors  Patient Stress Factors Major life changes;Health changes  Offered prayer and emotional support to Pt.

## 2016-09-04 NOTE — Consult Note (Signed)
[place] Nurse wound consult note Reason for Consult:Healing Stage 3 pressure injury to the coxxyx.  Nearly complete healing with scar and reepithelialization evident, small area remains open,. Wound type:Pressure Pressure Ulcer POA: Yes Measurement: 0.6 x 0.2cm x 0.2cm Wound [id number], moist, partially obscured by the presence of dried serum, scab. Drainage (amount, consistency, odor) none Periwound:intact Dressing procedure/placement/frequency: It has been noted that the patient is non-compliant with the POC as it related to turning and repositioning off of the involved bony prominence.  I will place a prophylactic sacral foam dressing and implement a therapeutic mattress replacement with low air loss feature for her comfort today.  Additionally, we will provide a pressure redistribution chair pad for her chair. [place] nursing team will not follow, but will remain available to this patient, the nursing and medical teams.  Please re-consult if needed. Thanks, Maudie Flakes, MSN, RN, [place], Arther Abbott  Pager# [phone]

## 2016-09-04 NOTE — Progress Notes (Signed)
Subjective: Dawn Watson continues to experience sacral pain however she says this pain has improved significantly since admission. She and nursing have had difficulty with finding positioning and padding which is comfortable for her. She denies any new concerns or complaints.  Today she is oriented to person, place and the president although she says the date is September 02, 2001.   She states she would like to go home to [place] and does not want to go to a skilled nursing facility.  Her granddaughter was contacted over the phone and states she wants the patient to come home with her and does not want her to go to SNF because she didn't like how the patient was treated there. Her granddaughter states that she has home health nursing arranged and they provide the patient with all of her medications and has no difficulty with cooking or providing food for the patient. Later in the day her daughter was contacted once again she states that she didn't understand that SNF would be a short-term stay and that now she thinks the patient would indeed benefit from SNF.   Objective:  Vital signs in last 24 hours: Vitals:   09/03/16 1409 09/03/16 2000 09/04/16 0417 09/04/16 1300  BP: 120/69 130/60 (!) 149/86 138/71  Pulse: 73 69 69 63  Resp: 18 20 (!) 24   Temp: 98.8 F (37.1 C) 98.6 F (37 C) 98.6 F (37 C) 98.9 F (37.2 C)  TempSrc: Oral  Oral Oral  SpO2: 100% 100% 100%   Weight:   110 lb 1.6 oz (49.9 kg)   Height:       Physical Exam  Constitutional: She appears well-developed and well-nourished.  Cardiovascular: Normal rate and regular rhythm.   No murmur heard. Pulmonary/Chest: Breath sounds normal. No respiratory distress. She has no wheezes. She has no rales.  Abdominal: Soft. Bowel sounds are normal. She exhibits no distension. There is no tenderness.  Neurological: She is alert.  Oriented x2     Labs: CBC:  Recent Labs Lab 09/01/16 0903 09/02/16 0327  09/03/16 0445 09/04/16 0506  WBC 9.5 11.2* 11.2* 9.5  NEUTROABS 6.4  --   --   --   HGB 9.1* 9.9* 9.2* 8.4*  HCT 27.9* 30.7* 28.0* 25.8*  MCV 91.8 91.4 91.5 91.2  PLT 249 280 [id number]   Metabolic Panel:  Recent Labs Lab 09/01/16 0903 09/01/16 0928 09/01/16 1513 09/02/16 0327 09/03/16 0445 09/04/16 0506  NA 141  --   --  140 142 140  K 3.8  --   --  3.9 4.0 3.9  CL 107  --   --  109 109 110  CO2 26  --   --  22 26 23   GLUCOSE 92  --   --  78 83 85  BUN 26*  --   --  24* 22* 20  CREATININE 3.15*  --   --  2.63* 2.72* 2.51*  CALCIUM 9.1  --   --  8.9 9.0 8.8*  MG  --  2.0  --   --   --   --   PHOS  --   --   --   --   --  2.8  ALT  --  34  --   --   --   --   ALKPHOS  --  101  --   --   --   --   BILITOT  --  0.8  --   --   --   --  PROT  --  5.5*  --   --   --   --   ALBUMIN  --  2.7*  --   --   --  2.6*  LABPROT  --   --  17.3*  --  17.8* 18.2*  INR  --   --  1.40  --  1.46 1.49    Medications: Infusions:   Scheduled Medications: . [START ON 09/05/2016] amiodarone  100 mg Oral Daily  . clopidogrel  75 mg Oral Daily  . diclofenac sodium  2 g Topical QID  . diltiazem  30 mg Oral TID  . enoxaparin (LOVENOX) injection  1 mg/kg Subcutaneous Q24H  . feeding supplement (ENSURE ENLIVE)  237 mL Oral BID BM  . levothyroxine  50 mcg Oral QAC breakfast  . sodium chloride flush  3 mL Intravenous Q12H  . warfarin  2.5 mg Oral ONCE-1800  . Warfarin - Pharmacist Dosing Inpatient   Does not apply q1800   PRN Medications: acetaminophen, diphenhydrAMINE, LORazepam, magnesium hydroxide  Assessment/Plan: Pt is a 82 y.o. yo female with a PMHx of Coronary artery disease, acute renal failure, paroxysmal A. Fib, and hypothyroidism who was admitted on 09/01/2016 with symptoms of altered mental status which has shown significant improvement but remains difficult to evaluate with an unknown baseline.   Principal Problem:   Thyroid activity decreased Active Problems:   Troponin level  elevated   Abnormal EKG   CAD in native artery, hx of stents   CKD (chronic kidney disease) stage 5, GFR less than 15 ml/min (HCC)   History of TB (tuberculosis)   History of coronary artery stent placement   History of acute inferior wall MI  Altered Mental Status  Continues to improve. The etiology is unclear but may be related to her recent changes in environment- in the past two months she has gone from living at home in [place] to being hospitalized followed by nursing home stay followed by being moved to her granddaughter in [place]. On admission she was also found to have uncontrolled hypothyroid and this may also be a factor.  - continue levothyroxine 50 mcg daily  -will need repeat TSH in 6 weeks outpatient   Left lower extremity DVT This was untreated at home her INR was subtherapeutic on presentation. She was started on Lovenox and Coumadin however her INR remains subtherapeutic but is slowly improving. Echo was performed and did not show a thrombus. - continue lovenox and bridge to coumadin -pharmacology is following they recommend 5 more days of Lovenox therapy and decrease to Coumadin 2 mg at the time of discharge  Coronary artery disease s/p stent  She previously hospitalized in August and was found to have occlusion of mid LAD at the site of prior stent distal LAD. PTCA was performed to the mid LAD lesion.   Paroxysmal atrial fibrilation  This was discovered at previous hospitalization in August. She was started on amiodarone and Coumadin but was found to have INR greater than 6 so Coumadin was held with plans to restart an INR was< 2. Cardiology is following, we appreciate their recommendations.  -continue amiodarone 100 mg   Acute on chronic renal failure Creatinine was 3.15 on admission continues to improve. Her baseline creatinine is unknown however on  cardiology's review of her prior admission it was found that she had oliguria and acute renal failure as a  complication after catheterization.  -Would recommend checking PTH, Vit D, phos levels outpatient.   Anemia of  chronic disease Iron studies showed normal iron and elevated ferritin. Decreased TIBC make iron deficient anemia less likely.   Leukocytosis:  Mild leukocytosis the day after presentation has resolved   Sacral pain:  No clear skin breakdown or source of pain. Will continue to monitor daily. Relieve pressure from area. -Voltaren gel prn.  Dispo: Anticipated discharge in approximately 1-2 day(s).   LOS: 2 days   Ledell Noss, MD 09/04/2016, 2:56 PM Pager: [phone]

## 2016-09-04 NOTE — Plan of Care (Signed)
Problem: Pain Managment: Goal: General experience of comfort will improve Outcome: Not Progressing Patient complaining of severe buttocks pain. Tylenol and Voltaren gel given to patient with minimal relief. Patient encouraged to not lie flat on back to relieve pressure of bottom, patient noncompliant. Will continue to educate and monitor.

## 2016-09-04 NOTE — Progress Notes (Signed)
ANTICOAGULATION CONSULT NOTE - Follow Up Consult  Pharmacy Consult for therapeutic Lovenox and Warfarin Indication: DVT  No Known Allergies  Patient Measurements: Height: 5\' 6"  (167.6 cm) Weight: 110 lb 1.6 oz (49.9 kg) IBW/kg (Calculated) : 59.3  Heparin dosing weight: 57 kg   Vital Signs: Temp: 98.6 F (37 C) (10/02 0417) Temp Source: Oral (10/02 0417) BP: 149/86 (10/02 0417) Pulse Rate: 69 (10/02 0417)  Labs:  Recent Labs  09/01/16 1513 09/01/16 2144  09/02/16 0327 09/02/16 [id number] 09/02/16 1353 09/03/16 0445 09/04/16 0506  HGB  --   --   < > 9.9*  --   --  9.2* 8.4*  HCT  --   --   --  30.7*  --   --  28.0* 25.8*  PLT  --   --   --  280  --   --  262 233  LABPROT 17.3*  --   --   --   --   --  17.8* 18.2*  INR 1.40  --   --   --   --   --  1.46 1.49  HEPARINUNFRC  --  0.80*  --  0.96*  --  0.76*  --   --   CREATININE  --   --   --  2.63*  --   --  2.72* 2.51*  TROPONINI 0.26* 0.31*  --   --  0.28*  --   --   --   < > = values in this interval not displayed.  Estimated Creatinine Clearance: 12.4 mL/min (by C-G formula based on SCr of 2.51 mg/dL (H)).  Assessment: 53 YOF with bilateral lower extremity edema and recent DVT on warfarin 1mg  daily PTA for DVT. INR on admission was low at 1.4. Dopplers show continued LLE DVT. Started on hep gtt for DVT (pre-existing?) now stopped and switched to Lovenox.  Starting therapeutic lovenox 1mg /kg bridge and warfarin. Pt noted to be on triple therapy so aspirin stopped. INR trending up slowly to 1.49 today. Hgb down to 8.4, plts wnl. No s/s of bleed. SCr down to 2.51, CrCl ~10-33ml/min.  Goal of Therapy:  INR 2-3 Monitor platelets by anticoagulation protocol: Yes   Plan:  Continue enoxaparin 50mg  [place] Q24 Give Coumadin 2.5mg  PO x 1 tonight Monitor daily INR, CBC, s/s of bleed  Elenor Quinones, PharmD, [facility] Clinical Pharmacist Pager [phone] 09/04/2016 9:39 AM
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
## 2016-09-04 NOTE — Progress Notes (Signed)
Patient Name: Dawn Watson Date of Encounter: 09/04/2016  Hospital Problem List     Principal Problem:   Thyroid activity decreased Active Problems:   Troponin level elevated   Abnormal EKG   CAD in native artery, hx of stents   CKD (chronic kidney disease) stage 5, GFR less than 15 ml/min (HCC)   History of TB (tuberculosis)   History of coronary artery stent placement   History of acute inferior wall MI    Patient Profile     82 yr old female with recent DVT, ulcers on buttocks, HTN, kidney disease, and hx of MI with stent to coronary artery.  This per pt was > 2 years ago in [place], [place]. She is on amiodarone.  It is not clear that she is taking anticoagulation.   INR was mildly elevated.    We were asked to see secondary to elevated troponin.   Subjective   Wanting to get up to the chair today. No reports of anginal symptoms.   Inpatient Medications    . amiodarone  200 mg Oral Daily  . clopidogrel  75 mg Oral Daily  . diclofenac sodium  2 g Topical QID  . diltiazem  30 mg Oral TID  . enoxaparin (LOVENOX) injection  1 mg/kg Subcutaneous Q24H  . feeding supplement (ENSURE ENLIVE)  237 mL Oral BID BM  . Influenza vac split quadrivalent PF  0.5 mL Intramuscular Tomorrow-1000  . levothyroxine  50 mcg Oral QAC breakfast  . sodium chloride flush  3 mL Intravenous Q12H  . warfarin  2.5 mg Oral ONCE-1800  . Warfarin - Pharmacist Dosing Inpatient   Does not apply q1800    Vital Signs    Vitals:   09/03/16 0400 09/03/16 1409 09/03/16 2000 09/04/16 0417  BP: (!) 151/67 120/69 130/60 (!) 149/86  Pulse: 73 73 69 69  Resp: (!) 24 18 20  (!) 24  Temp: 98.4 F (36.9 C) 98.8 F (37.1 C) 98.6 F (37 C) 98.6 F (37 C)  TempSrc:  Oral  Oral  SpO2: 100% 100% 100% 100%  Weight: 110 lb 11.2 oz (50.2 kg)   110 lb 1.6 oz (49.9 kg)  Height:        Intake/Output Summary (Last 24 hours) at 09/04/16 1000 Last data filed at 09/04/16 0943  Gross per 24 hour  Intake               720 ml  Output                0 ml  Net              720 ml   Filed Weights   09/02/16 0457 09/03/16 0400 09/04/16 0417  Weight: 124 lb 12.8 oz (56.6 kg) 110 lb 11.2 oz (50.2 kg) 110 lb 1.6 oz (49.9 kg)    Physical Exam    GEN: Well nourished, well developed, in mild distress.  Neck: Supple, no JVD, carotid bruits, or masses. Cardiac: RRR, no rubs, or gallops. No clubbing, cyanosis, trace edema.  Radials/DP/PT 2+ and equal bilaterally.  Respiratory:  Respirations  regular and unlabored, clear to auscultation bilaterally. GI: Soft, nontender, nondistended, BS + x 4. Neuro:  Strength and sensation are intact.   Labs    CBC  Recent Labs  09/03/16 0445 09/04/16 0506  WBC 11.2* 9.5  HGB 9.2* 8.4*  HCT 28.0* 25.8*  MCV 91.5 91.2  PLT 262 0000000   Basic Metabolic Panel  Recent Labs  09/03/16 0445 09/04/16 0506  NA 142 140  K 4.0 3.9  CL 109 110  CO2 26 23  GLUCOSE 83 85  BUN 22* 20  CREATININE 2.72* 2.51*  CALCIUM 9.0 8.8*  PHOS  --  2.8   Liver Function Tests  Recent Labs  09/04/16 0506  ALBUMIN 2.6*   No results for input(s): LIPASE, AMYLASE in the last 72 hours. Cardiac Enzymes  Recent Labs  09/01/16 1513 09/01/16 2144 09/02/16 0643  TROPONINI 0.26* 0.31* 0.28*   BNP Invalid input(s): POCBNP D-Dimer No results for input(s): DDIMER in the last 72 hours. Hemoglobin A1C No results for input(s): HGBA1C in the last 72 hours. Fasting Lipid Panel No results for input(s): CHOL, HDL, LDLCALC, TRIG, CHOLHDL, LDLDIRECT in the last 72 hours. Thyroid Function Tests  Recent Labs  09/01/16 1513  T3FREE 1.3*    Telemetry    NSR.   ECG    NA  Radiology    TTE: 09/03/2016  Study Conclusions  - Left ventricle: The cavity size was normal. Wall thickness was   increased in a pattern of mild LVH. The estimated ejection   fraction was 55%. Wall motion was normal; there were no regional   wall motion abnormalities. Doppler parameters are consistent  with   restrictive physiology, indicative of decreased left ventricular   diastolic compliance and/or increased left atrial pressure. - Aortic valve: Trileaflet; mildly calcified leaflets. There was   trivial regurgitation. - Mitral valve: Mildly thickened leaflets . There was trivial   regurgitation. - Left atrium: The atrium was mildly dilated. - Right atrium: Central venous pressure (est): 3 mm Hg. - Atrial septum: No defect or patent foramen ovale was identified. - Tricuspid valve: There was mild regurgitation. - Pulmonary arteries: PA peak pressure: 40 mm Hg (S). - Pericardium, extracardiac: There was no pericardial effusion.  Impressions:  - Mild LVH with LVEF approximately 55%. Restrictive diastolic   filling pattern with increased filling pressures. Mildly dilated   left atrium. Mildly thickened mitral leaflets with trivial mitral   regurgitation. Mildly sclerotic aortic valve with trivial aortic   regurgitation. Mild tricuspid regurgitation with PASP 40 mmHg.  Assessment & Plan    1. PROLONGED QTC:  Likely secondary to amiodarone.  Amiodarone induced QT prolongation rarely leads to torasades.  Cardiac records still pending, but appears to have had atrial fibrillation after having PCI to the her LAD. Also on Cardizem. Reduce amio?  2. CAD:   Troponin trend is flat and non diagnostic in this setting.  She has no acute cardiac complaints.  Echo showed EF of 55% and no WMA.   3. AMIODARONE:   Appears that she had atrial fibrillation after having PCI to her LAD and re-stenosis back in 6/17? Was on Coumadin at one point, but documented in records possible toxicity and inability to regulate. Cardiology records still pending.   4. DVT: Plan per primary team.   Signed, Reino Bellis, NP  09/04/2016, 10:00 AM  Patient seen and examined and history reviewed. Agree with above findings and plan.  Patient is elderly and chronically ill appearing. Lungs are clear. No gallop  No  edema.  I reviewed DC summary from 07/04/16 at outside hospital. She was found to have occlusion of the mid LAD at site of prior stent. She underwent PTCA of this site. Also noted to have 80% distal LAD stenosis that could not be opened due to subintimal position of wire. Post cath complicated by oliguric ARF with creatinine over 7.  She also had paroxysmal Afib and was started on amiodarone and coumadin. INR went over 6 and coumadin was held with plans to resume when INR <2.   She is now admitted with DVT. On Lovenox with bridge to coumadin. Troponin elevation is with a flat trend and is not associated with chest pain or Ecg findings. EF is good by Echo. I would avoid any contrast procedures in the future given her prior ARF  unless situation is life threatening. We will reduce amiodarone to 100 mg daily to try and minimize toxicity.   Vail Basista [place], [place] 09/04/2016 10:34 AM
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
## 2016-09-05 DIAGNOSIS — I480 Paroxysmal atrial fibrillation: Secondary | ICD-10-CM

## 2016-09-05 DIAGNOSIS — I82409 Acute embolism and thrombosis of unspecified deep veins of unspecified lower extremity: Secondary | ICD-10-CM

## 2016-09-05 DIAGNOSIS — D638 Anemia in other chronic diseases classified elsewhere: Secondary | ICD-10-CM

## 2016-09-05 LAB — BASIC METABOLIC PANEL
Anion gap: 5 (ref 5–15)
BUN: 21 mg/dL — ABNORMAL HIGH (ref 6–20)
CALCIUM: 8.7 mg/dL — AB (ref 8.9–10.3)
CO2: 25 mmol/L (ref 22–32)
CREATININE: 2.7 mg/dL — AB (ref 0.44–1.00)
Chloride: 108 mmol/L (ref 101–111)
GFR calc Af Amer: 17 mL/min — ABNORMAL LOW (ref >60)
GFR calc non Af Amer: 15 mL/min — ABNORMAL LOW (ref >60)
GLUCOSE: 84 mg/dL (ref 65–99)
Potassium: 4.2 mmol/L (ref 3.5–5.1)
Sodium: 138 mmol/L (ref 135–145)

## 2016-09-05 LAB — PROTIME-INR
INR: 1.64
PROTHROMBIN TIME: 19.6 s — AB (ref 11.4–15.2)

## 2016-09-05 MED ORDER — DICLOFENAC SODIUM 1 % TD GEL: 2.0000 g | Freq: Four times a day (QID) | TRANSDERMAL | 0 refills | Status: AC

## 2016-09-05 MED ORDER — AMIODARONE HCL 100 MG PO TABS: 100.0000 mg | ORAL_TABLET | Freq: Every day | ORAL | 0 refills | Status: AC

## 2016-09-05 MED ORDER — LEVOTHYROXINE SODIUM 50 MCG PO TABS: 50.0000 ug | ORAL_TABLET | Freq: Every day | ORAL | 0 refills | Status: AC

## 2016-09-05 MED ORDER — WARFARIN SODIUM 2.5 MG PO TABS: 2.5000 mg | ORAL_TABLET | Freq: Once | ORAL | Status: DC

## 2016-09-05 MED ORDER — WARFARIN SODIUM 2 MG PO TABS: 1.0000 mg | ORAL_TABLET | Freq: Every day | ORAL | 0 refills | Status: AC

## 2016-09-05 NOTE — Progress Notes (Signed)
Subjective: [name] continues to experience lower back pain today.  Nursing has been very patiently adjusting her position frequently overnight and moving her from bed to chair as she request. She is sitting up in a chair with pillows offloading her lower back.    Objective:  Vital signs in last 24 hours: Vitals:   09/04/16 1300 09/04/16 1701 09/04/16 2241 09/05/16 0330  BP: 138/71 129/60 140/80 137/62  Pulse: 63  72 73  Resp:   (!) 24 (!) 24  Temp: 98.9 F (37.2 C)  98.4 F (36.9 C) 98.4 F (36.9 C)  TempSrc: Oral  Oral Oral  SpO2:    99%  Weight:    110 lb 6.4 oz (50.1 kg)  Height:       Physical Exam  Constitutional: She is oriented to person, place, and time. She appears well-developed and well-nourished.  Cardiovascular: Normal rate and regular rhythm.   No murmur heard. Abdominal: Soft. She exhibits no distension. There is no tenderness. There is no guarding.  Neurological: She is alert and oriented to person, place, and time.  Extremities: no calf tenderness, no peripheral edema   Labs: Metabolic Panel:  Recent Labs Lab 09/01/16 0903 09/01/16 0928 09/01/16 1513 09/02/16 0327 09/03/16 0445 09/04/16 0506 09/05/16 0357  NA 141  --   --  140 142 140 138  K 3.8  --   --  3.9 4.0 3.9 4.2  CL 107  --   --  109 109 110 108  CO2 26  --   --  22 26 23 25   GLUCOSE 92  --   --  78 83 85 84  BUN 26*  --   --  24* 22* 20 21*  CREATININE 3.15*  --   --  2.63* 2.72* 2.51* 2.70*  CALCIUM 9.1  --   --  8.9 9.0 8.8* 8.7*  MG  --  2.0  --   --   --   --   --   PHOS  --   --   --   --   --  2.8  --   ALT  --  34  --   --   --   --   --   ALKPHOS  --  101  --   --   --   --   --   BILITOT  --  0.8  --   --   --   --   --   PROT  --  5.5*  --   --   --   --   --   ALBUMIN  --  2.7*  --   --   --  2.6*  --   LABPROT  --   --  17.3*  --  17.8* 18.2* 19.6*  INR  --   --  1.40  --  1.46 1.49 1.64    Medications:   Scheduled Medications: . amiodarone  100 mg  Oral Daily  . clopidogrel  75 mg Oral Daily  . diclofenac sodium  2 g Topical QID  . diltiazem  30 mg Oral TID  . enoxaparin (LOVENOX) injection  1 mg/kg Subcutaneous Q24H  . feeding supplement (ENSURE ENLIVE)  237 mL Oral BID BM  . levothyroxine  50 mcg Oral QAC breakfast  . sodium chloride flush  3 mL Intravenous Q12H  . Warfarin - Pharmacist Dosing Inpatient   Does not apply q1800   PRN Medications: acetaminophen, diphenhydrAMINE,  LORazepam, magnesium hydroxide  Assessment/Plan: Pt is a 82 y.o. yo female with a PMHx of Coronary artery disease, acute renal failure, paroxysmal A. Fib, and hypothyroidism who was admitted on 09/01/2016 with symptoms of altered mental status which has shown significant improvement but remains difficult to evaluate with an unknown baseline. She has a complicated social history but when discussing disposition with her family they thought she would benefit from SNF.   Principal Problem:   Thyroid activity decreased Active Problems:   Troponin level elevated   Abnormal EKG   CAD in native artery, hx of stents   CKD (chronic kidney disease) stage 5, GFR less than 15 ml/min (HCC)   History of TB (tuberculosis)   History of coronary artery stent placement   History of acute inferior wall MI  Altered Mental Status  Continues to improve. The etiology is unclear but may be related to her recent changes in environment. On admission she was  found to have uncontrolled hypothyroid and this may also be a factor.  - continue levothyroxine 50 mcg daily  -will need repeat TSH in 6 weeks outpatient   Left lower extremity DVT This was untreated at home her INR was subtherapeutic on presentation. She was started on Lovenox and Coumadin however her INR remains subtherapeutic but is improving.  - continue lovenox and bridge to coumadin -pharmacology is following they recommend 4 more days of Lovenox therapy and decrease to Coumadin 2 mg at the time of  discharge  Coronary artery disease s/p stent  She has continued to deny chest pain this admission, she has not required any anti anginal medications. Cardiology is following, we appreciate their recommendations.  -continue lovenox bridge to warfarin as mentioned above.   Paroxysmal atrial fibrilation  This was discovered at previous hospitalization in August. She was started on amiodarone and Coumadin but was found to have INR greater than 6 so Coumadin was held with plans to restart when INR was< 2. Cardiology is following, we appreciate their recommendations.  -continue amiodarone 100 mg  -continue lovenox to warfarin bridge   Acute on chronic renal failure Creatinine was 3.15 on admission continues to improve. Her baseline creatinine is unknown however on  cardiology's review of her prior admission it was found that she had oliguria and acute renal failure as a complication after catheterization.  -Would recommend checking PTH, Vit D, phos levels outpatient.   Anemia of chronic disease Iron studies showed normal iron and elevated ferritin. Decreased TIBC make iron deficient anemia less likely.   Leukocytosis:  Mild leukocytosis the day after presentation has resolved   Sacral pain:  No clear skin breakdown or source of pain. Will continue to monitor daily. Relieve pressure from area. She continues to have regular bowel movements. This pain is most likely related to a pressure ulcer at this site during prior admission.  -continue Voltaren gel prn -continue wound care  Dispo: Anticipated discharge today.   LOS: 3 days   Ledell Noss, MD 09/05/2016, 6:54 AM Pager: [phone]

## 2016-09-05 NOTE — NC FL2 (Signed)
[place] LEVEL OF CARE SCREENING TOOL     IDENTIFICATION  Patient Name: Dawn Watson Birthdate: 02-21-1927 Sex: female Admission Date (Current Location): 09/01/2016  [facility] and [place] Number:  Herbalist and Address:  [facility] [place], [place] [address] [place], [place] [address]      Provider Number: [id number]  Attending Physician Name and Address:  Annia Belt, MD  Relative Name and Phone Number:       Current Level of Care: Hospital Recommended Level of Care: [place] Prior Approval Number:    Date Approved/Denied: 09/05/16 PASRR Number: [id number]  Discharge Plan: SNF    Current Diagnoses: Patient Active Problem List   Diagnosis Date Noted  . History of TB (tuberculosis)   . History of coronary artery stent placement   . History of acute inferior wall MI   . Troponin level elevated 09/01/2016  . Abnormal EKG 09/01/2016  . CAD in native artery, hx of stents 09/01/2016  . Thyroid activity decreased 09/01/2016  . CKD (chronic kidney disease) stage 5, GFR less than 15 ml/min (HCC) 09/01/2016    Orientation RESPIRATION BLADDER Height & Weight     Self, Time, Situation, Place  O2 (Nasal Cannula 2L) Continent Weight: 110 lb 6.4 oz (50.1 kg) Height:  5\' 6"  (167.6 cm)  BEHAVIORAL SYMPTOMS/MOOD NEUROLOGICAL BOWEL NUTRITION STATUS      Continent Diet (Heart Healthy thin liquids)  AMBULATORY STATUS COMMUNICATION OF NEEDS Skin   Limited Assist Verbally Normal                       Personal Care Assistance Level of Assistance  Bathing, Feeding, Dressing Bathing Assistance: Limited assistance Feeding assistance: Independent Dressing Assistance: Limited assistance     Functional Limitations Info  Sight, Hearing, Speech Sight Info: Adequate Hearing Info: Adequate Speech Info: Adequate    SPECIAL CARE FACTORS FREQUENCY  PT (By licensed PT), OT (By licensed OT)     PT Frequency: 3x  week OT Frequency: 3x week            Contractures Contractures Info: Not present    Additional Factors Info  Code Status, Allergies Code Status Info: Full Allergies Info: No known           Current Medications (09/05/2016):  This is the current hospital active medication list Current Facility-Administered Medications  Medication Dose Route Frequency Provider Last Rate Last Dose  . acetaminophen (TYLENOL) tablet 650 mg  650 mg Oral Q6H PRN Alphonzo Grieve, MD   650 mg at 09/05/16 0826  . amiodarone (PACERONE) tablet 100 mg  100 mg Oral Daily Peter M Martinique, MD   100 mg at 09/05/16 0826  . clopidogrel (PLAVIX) tablet 75 mg  75 mg Oral Daily Norman Herrlich, MD   75 mg at 09/05/16 0826  . diclofenac sodium (VOLTAREN) 1 % transdermal gel 2 g  2 g Topical QID Maryellen Pile, MD   2 g at 09/05/16 0827  . diltiazem (CARDIZEM) tablet 30 mg  30 mg Oral TID Norman Herrlich, MD   30 mg at 09/05/16 0826  . diphenhydrAMINE (BENADRYL) capsule 25 mg  25 mg Oral Q6H PRN Bethany Molt, DO   25 mg at 09/05/16 0826  . enoxaparin (LOVENOX) injection 50 mg  1 mg/kg Subcutaneous Q24H Honor Loh, RPH   50 mg at 09/04/16 1702  . feeding supplement (ENSURE ENLIVE) (ENSURE ENLIVE) liquid 237 mL  237 mL  Oral BID BM Annia Belt, MD   237 mL at 09/05/16 0827  . levothyroxine (SYNTHROID, LEVOTHROID) tablet 50 mcg  50 mcg Oral QAC breakfast Norman Herrlich, MD   50 mcg at 09/05/16 [phone]  . LORazepam (ATIVAN) tablet 0.5 mg  0.5 mg Oral QHS PRN Norman Herrlich, MD   0.5 mg at 09/03/16 2155  . magnesium hydroxide (MILK OF MAGNESIA) suspension 15 mL  15 mL Oral Daily PRN Bethany Molt, DO   15 mL at 09/02/16 1223  . sodium chloride flush (NS) 0.9 % injection 3 mL  3 mL Intravenous Q12H Carly J Rivet, MD   3 mL at 09/04/16 1000  . Warfarin - Pharmacist Dosing Inpatient   Does not apply [place], [facility]         Discharge Medications: Please see discharge summary for a list of discharge  medications.  Relevant Imaging Results:  Relevant Lab Results:   Additional Information SSN: [id number]  Alla German, LCSW

## 2016-09-05 NOTE — Discharge Instructions (Signed)
Thank you for trusting [place] with your medical care!  You were hospitalized for lower back pain and treated with voltaren gel and taking weight off of your back   Please take note of the following changes to your medications: Start taking amiodarone 100 mg daily, stop taking amiodarone 200 mg pills that you have at home   Start taking levothyroxine 50 mg daily, stop taking levothyroxine 25 mg pills that you have at home   To make sure you are getting better, please make it to the follow-up appointments listed on the first page.  If you have any questions, please call [phone].

## 2016-09-05 NOTE — Clinical Social Work Note (Signed)
Clinical Social Worker received notification from MD regarding potential placement needs.  Per MD, patient does not have the ability to make her own decisions.  MD confirmed granddaughter wishes for ST-SNF placement at discharge.  CSW initiated referral.  CSW spoke with patient and granddaughter at bedside and they both agree that patient will return home today.  Patient and patient granddaughter are making arrangements for patient to return to [facility] with her husband as soon as transportation is available.  MD and RNCM updated.  Clinical Social Worker will sign off for now as social work intervention is no longer needed. Please consult [place] again if new need arises.   Barbette Or, [place]

## 2016-09-05 NOTE — Progress Notes (Signed)
         [facility]    Date of Admission:  09/01/2016     I examined Ms. Spielmann with Dr. Hetty Ely this morning. We also had the opportunity to talk with her granddaughter, Roque Cash. Ms. Eckel has improved and is no longer confused. She was able to walk in the hall this morning. She will be discharged home with her granddaughter and continued support from [place]. The granddaughter states that the longer term plan is to have her to move back to [place], [place]. The family plans on renting a new home where she and her husband can be cared for by a full-time caregiver.        Michel Bickers, MD [facility] [phone] pager   [phone] cell

## 2016-09-05 NOTE — Progress Notes (Signed)
Patient Name: Dawn Watson Date of Encounter: 09/05/2016  Principal Problem:   Thyroid activity decreased Active Problems:   Troponin level elevated   Abnormal EKG   CAD in native artery, hx of stents   CKD (chronic kidney disease) stage 5, GFR less than 15 ml/min (HCC)   History of TB (tuberculosis)   History of coronary artery stent placement   History of acute inferior wall MI   Primary Cardiologist: Dr Percival Spanish Patient Profile: 82 yr old female with recent DVT, ulcers on buttocks, HTN, kidney disease, and hx of MI with mLAD stent and PTCA for ISR in [place]. Amiodarone and coumadin for post-PTCA PAF. Admitted 09/29 w/ AMS, QTc 573 ms, trop 0.31  SUBJECTIVE: Wants to call her former employer MD, says has been coughing, non-productive, not breathing as well today as yesterday. No chest pain or palpitations.  OBJECTIVE Vitals:   09/04/16 1300 09/04/16 1701 09/04/16 2241 09/05/16 0330  BP: 138/71 129/60 140/80 137/62  Pulse: 63  72 73  Resp:   (!) 24 (!) 24  Temp: 98.9 F (37.2 C)  98.4 F (36.9 C) 98.4 F (36.9 C)  TempSrc: Oral  Oral Oral  SpO2:    99%  Weight:    110 lb 6.4 oz (50.1 kg)  Height:        Intake/Output Summary (Last 24 hours) at 09/05/16 0933 Last data filed at 09/05/16 0330  Gross per 24 hour  Intake              683 ml  Output              200 ml  Net              483 ml   Filed Weights   09/03/16 0400 09/04/16 0417 09/05/16 0330  Weight: 110 lb 11.2 oz (50.2 kg) 110 lb 1.6 oz (49.9 kg) 110 lb 6.4 oz (50.1 kg)    PHYSICAL EXAM General: Well developed, well nourished, female in no acute distress. Head: Normocephalic, atraumatic.  Neck: Supple without bruits, JVD not elevated. Lungs:  Resp regular and unlabored, rales bilateral bases. Heart: RRR, S1, S2, no S3, S4, 2/6 murmur; no rub. Abdomen: Soft, non-tender, non-distended, BS + x 4.  Extremities: No clubbing, cyanosis, edema.  Neuro: Alert and oriented X 3. Moves all  extremities spontaneously. Psych: Normal affect.  LABS: CBC: Recent Labs  09/03/16 0445 09/04/16 0506  WBC 11.2* 9.5  HGB 9.2* 8.4*  HCT 28.0* 25.8*  MCV 91.5 91.2  PLT 262 233   INR: Recent Labs  09/05/16 0357  INR [id number]   Basic Metabolic Panel: Recent Labs  09/04/16 0506 09/05/16 0357  NA 140 138  K 3.9 4.2  CL 110 108  CO2 23 25  GLUCOSE 85 84  BUN 20 21*  CREATININE 2.51* 2.70*  CALCIUM 8.8* 8.7*  PHOS 2.8  --    Liver Function Tests: Recent Labs  09/04/16 0506  ALBUMIN 2.6*   BNP:  B Natriuretic Peptide  Date/Time Value Ref Range Status  09/01/2016 09:03 AM 810.7 (H) 0.0 - 100.0 pg/mL Final   Anemia Panel: Recent Labs  09/04/16 0506  FERRITIN 2,953*  TIBC 130*  IRON 51   Lab Results  Component Value Date   TSH 32.150 (H) 09/01/2016   FREE T3 .1.3 (LOW)    FREE T4 .1.04 (NL)         TELE: SR, no sig ectopy  Dg Chest 2 View Result  Date: 09/01/2016 CLINICAL DATA:  82 year old with weakness. History of myocardial infarction, hypertension and DVT. No relevant surgical history provided. EXAM: CHEST  2 VIEW COMPARISON:  None. FINDINGS: The heart is enlarged. There is a coronary artery stent and aortic atherosclerosis. There is suspected chronic lung disease with interstitial prominence, central airway thickening and volume loss in the left upper lobe. There is asymmetric left apical pleural and subpleural density, likely the sequela of prior granulomatous disease. Some of these findings could be postsurgical. The patient appears to be status post partial resection of the fifth ribs bilaterally. No acute osseous findings are seen. IMPRESSION: No suspected acute findings. Evaluation limited by the lack of prior studies. There are apparent postsurgical changes status post partial resection of the fifth ribs bilaterally. Cardiomegaly and probable sequela of granulomatous disease in the left upper lobe. Correlate clinically. Electronically Signed   By: Richardean Sale M.D.   On: 09/01/2016 10:44    Current Medications:  . amiodarone  100 mg Oral Daily  . clopidogrel  75 mg Oral Daily  . diclofenac sodium  2 g Topical QID  . diltiazem  30 mg Oral TID  . enoxaparin (LOVENOX) injection  1 mg/kg Subcutaneous Q24H  . feeding supplement (ENSURE ENLIVE)  237 mL Oral BID BM  . levothyroxine  50 mcg Oral QAC breakfast  . sodium chloride flush  3 mL Intravenous Q12H  . warfarin  2.5 mg Oral ONCE-1800  . Warfarin - Pharmacist Dosing Inpatient   Does not apply q1800      ASSESSMENT AND PLAN: 1. PROLONGED QTC:  Likely secondary to amiodarone.  Amiodarone induced QT prolongation rarely leads to torasades. Had atrial fibrillation after having PCI to the her LAD. Also on Cardizem. Amio reduced  2. CAD:   Troponin trend is flat and non diagnostic in this setting.  She has no acute cardiac complaints.  Echo showed EF of 55% and no WMA. Not associated with chest pain or Ecg findings. Avoid any contrast procedures in the future given her prior ARF, unless situation is life threatening. MD advise how long pt should be on Plavix after PTCA.   3. AMIODARONE:   She had atrial fibrillation after having PCI to her LAD for ISR. Was on Coumadin, but difficult to regulate and it was on hold 2nd INR >6  4. DVT: Plan per primary team. Lovenox>>coumadin, tighter control needed. CHA2DS2VASc=7 (age x 2, female, DVT x 2, HTN, CAD).   Oherwise, per IM Principal Problem:   Thyroid activity decreased Active Problems:   Troponin level elevated   Abnormal EKG   CAD in native artery, hx of stents   CKD (chronic kidney disease) stage 5, GFR less than 15 ml/min (HCC)   History of TB (tuberculosis)   History of coronary artery stent placement   History of acute inferior wall MI   Signed, Lenoard Aden 9:33 AM 09/05/2016 Patient seen and examined and history reviewed. Agree with above findings and plan. No chest pain or SOB. Still in NSR. Amiodarone dose reduced  to 100 mg daily. Transitioning from Lovenox to coumadin. Patient should remain on plavix for one year from stent in August. No further cardiac recommendations. Will sign off- please call with questions.  Peter Martinique, [place] 09/05/2016 12:51 PM

## 2016-09-05 NOTE — Progress Notes (Signed)
ANTICOAGULATION CONSULT NOTE - Follow Up Consult  Pharmacy Consult for therapeutic Lovenox and Warfarin Indication: DVT  No Known Allergies  Patient Measurements: Height: 5\' 6"  (167.6 cm) Weight: 110 lb 6.4 oz (50.1 kg) IBW/kg (Calculated) : 59.3  Heparin dosing weight: 57 kg   Vital Signs: Temp: 98.4 F (36.9 C) (10/03 0330) Temp Source: Oral (10/03 0330) BP: 137/62 (10/03 0330) Pulse Rate: 73 (10/03 0330)  Labs:  Recent Labs  09/02/16 1353 09/03/16 0445 09/04/16 0506 09/05/16 0357  HGB  --  9.2* 8.4*  --   HCT  --  28.0* 25.8*  --   PLT  --  262 233  --   LABPROT  --  17.8* 18.2* 19.6*  INR  --  1.46 1.49 1.64  HEPARINUNFRC 0.76*  --   --   --   CREATININE  --  2.72* 2.51* 2.70*    Estimated Creatinine Clearance: 11.6 mL/min (by C-G formula based on SCr of 2.7 mg/dL (H)).  Assessment: 23 YOF with bilateral lower extremity edema and recent DVT on warfarin 1mg  daily PTA for DVT. INR on admission was low at 1.4. Dopplers show continued LLE DVT. Started on hep gtt for DVT (pre-existing?) now stopped and switched to Lovenox.   -INR is up to 1.64 and patient has been getting 2.5mg  coumadin with home dose 1mg /day hesitant to increase further at this point  Goal of Therapy:  INR 2-3 Monitor platelets by anticoagulation protocol: Yes   Plan:  Continue enoxaparin 50mg  [place] Q24 Give Coumadin 2.5mg  PO x 1 tonight Monitor daily INR  Hildred Laser, Pharm D 09/05/2016 8:37 AM

## 2016-09-05 NOTE — Care Management Important Message (Signed)
Important Message  Patient Details  Name: Dawn Watson MRN: [id number] Date of Birth: September 30, 1927   Medicare Important Message Given:  Yes    Dawn Watson [name] 09/05/2016, 11:31 AM

## 2016-09-11 ENCOUNTER — Emergency Department (HOSPITAL_COMMUNITY)
Admission: EM | Admit: 2016-09-11 | Discharge: 2016-09-11 | Disposition: A | Discharge status: Home / Self Care | Payer: Medicare Other | Attending: Emergency Medicine | Admitting: Emergency Medicine

## 2016-09-11 ENCOUNTER — Encounter (HOSPITAL_COMMUNITY): Payer: Self-pay | Admitting: *Deleted

## 2016-09-11 DIAGNOSIS — N185 Chronic kidney disease, stage 5: Secondary | ICD-10-CM | POA: Insufficient documentation

## 2016-09-11 DIAGNOSIS — I120 Hypertensive chronic kidney disease with stage 5 chronic kidney disease or end stage renal disease: Secondary | ICD-10-CM | POA: Diagnosis not present

## 2016-09-11 DIAGNOSIS — Z79899 Other long term (current) drug therapy: Secondary | ICD-10-CM | POA: Insufficient documentation

## 2016-09-11 DIAGNOSIS — E039 Hypothyroidism, unspecified: Secondary | ICD-10-CM | POA: Insufficient documentation

## 2016-09-11 DIAGNOSIS — I2510 Atherosclerotic heart disease of native coronary artery without angina pectoris: Secondary | ICD-10-CM | POA: Insufficient documentation

## 2016-09-11 DIAGNOSIS — Z853 Personal history of malignant neoplasm of breast: Secondary | ICD-10-CM | POA: Insufficient documentation

## 2016-09-11 DIAGNOSIS — R413 Other amnesia: Secondary | ICD-10-CM | POA: Diagnosis not present

## 2016-09-11 DIAGNOSIS — Z5181 Encounter for therapeutic drug level monitoring: Secondary | ICD-10-CM | POA: Diagnosis not present

## 2016-09-11 DIAGNOSIS — N189 Chronic kidney disease, unspecified: Secondary | ICD-10-CM

## 2016-09-11 DIAGNOSIS — Z7901 Long term (current) use of anticoagulants: Secondary | ICD-10-CM | POA: Insufficient documentation

## 2016-09-11 DIAGNOSIS — I252 Old myocardial infarction: Secondary | ICD-10-CM | POA: Diagnosis not present

## 2016-09-11 DIAGNOSIS — R609 Edema, unspecified: Secondary | ICD-10-CM | POA: Diagnosis not present

## 2016-09-11 DIAGNOSIS — R600 Localized edema: Secondary | ICD-10-CM

## 2016-09-11 DIAGNOSIS — M7989 Other specified soft tissue disorders: Secondary | ICD-10-CM | POA: Diagnosis present

## 2016-09-11 LAB — COMPREHENSIVE METABOLIC PANEL
ALBUMIN: 3.2 g/dL — AB (ref 3.5–5.0)
ALT: 50 U/L (ref 14–54)
AST: 60 U/L — AB (ref 15–41)
Alkaline Phosphatase: 86 U/L (ref 38–126)
Anion gap: 8 (ref 5–15)
BILIRUBIN TOTAL: 0.8 mg/dL (ref 0.3–1.2)
BUN: 16 mg/dL (ref 6–20)
CHLORIDE: 112 mmol/L — AB (ref 101–111)
CO2: 19 mmol/L — ABNORMAL LOW (ref 22–32)
Calcium: 8.9 mg/dL (ref 8.9–10.3)
Creatinine, Ser: 2.65 mg/dL — ABNORMAL HIGH (ref 0.44–1.00)
GFR calc Af Amer: 18 mL/min — ABNORMAL LOW (ref >60)
GFR, EST NON AFRICAN AMERICAN: 15 mL/min — AB (ref >60)
GLUCOSE: 90 mg/dL (ref 65–99)
Potassium: 4.3 mmol/L (ref 3.5–5.1)
Sodium: 139 mmol/L (ref 135–145)
TOTAL PROTEIN: 5.8 g/dL — AB (ref 6.5–8.1)

## 2016-09-11 LAB — CBC WITH DIFFERENTIAL/PLATELET
BASOS ABS: 0 10*3/uL (ref 0.0–0.1)
Basophils Relative: 0 %
EOS ABS: 0.1 10*3/uL (ref 0.0–0.7)
EOS PCT: 1 %
HCT: 26.2 % — ABNORMAL LOW (ref 36.0–46.0)
Hemoglobin: 8.7 g/dL — ABNORMAL LOW (ref 12.0–15.0)
Lymphocytes Relative: 19 %
Lymphs Abs: 1.6 10*3/uL (ref 0.7–4.0)
MCH: 30.6 pg (ref 26.0–34.0)
MCHC: 33.2 g/dL (ref 30.0–36.0)
MCV: 92.3 fL (ref 78.0–100.0)
MONO ABS: 0.5 10*3/uL (ref 0.1–1.0)
Monocytes Relative: 6 %
Neutro Abs: 6.4 10*3/uL (ref 1.7–7.7)
Neutrophils Relative %: 74 %
PLATELETS: 293 10*3/uL (ref 150–400)
RBC: 2.84 MIL/uL — AB (ref 3.87–5.11)
RDW: 18.3 % — AB (ref 11.5–15.5)
WBC: 8.5 10*3/uL (ref 4.0–10.5)

## 2016-09-11 LAB — I-STAT CG4 LACTIC ACID, ED: Lactic Acid, Venous: 1.03 mmol/L (ref 0.5–1.9)

## 2016-09-11 LAB — I-STAT TROPONIN, ED: TROPONIN I, POC: 0.22 ng/mL — AB (ref 0.00–0.08)

## 2016-09-11 LAB — PROTIME-INR
INR: 1.59
Prothrombin Time: 19.2 seconds — ABNORMAL HIGH (ref 11.4–15.2)

## 2016-09-11 MED ORDER — TRAZODONE HCL 50 MG PO TABS: 25.0000 mg | ORAL_TABLET | Freq: Every day | ORAL | 0 refills | Status: AC
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
## 2016-09-11 NOTE — ED Notes (Signed)
Pt. Given meal at this time.

## 2016-09-11 NOTE — ED Notes (Signed)
RN contacted case management who will reach out to on call social work. RN contacting great granddaughter at this time.

## 2016-09-11 NOTE — ED Notes (Signed)
EDP speaking with granddaughter and patient at this time.

## 2016-09-11 NOTE — Progress Notes (Signed)
CSW spoke with patients grand-daughter, Miya, regarding discharge. Patients grand-daughter seemed concerned about recent move from [facility] to [facility] and the impact it has had on the patient. Patients grand-daughter would like short-term rehab until they can find patient an  assisted living facility. CSW informed patients grand-daughter that we are unable to place from the ED. Patient is already receiving services from [place]. CSW consulted [facility] EDCM to give home resources including a social worker to help with placement.   Kingsley Spittle, [place] Clinical Social Worker [phone]

## 2016-09-11 NOTE — ED Notes (Signed)
Pt. Walked to restroom with 1 assist. Pt. Requested wheelchair for ride back.

## 2016-09-12 ENCOUNTER — Encounter (HOSPITAL_COMMUNITY): Payer: Self-pay | Admitting: Emergency Medicine

## 2016-09-12 ENCOUNTER — Ambulatory Visit (HOSPITAL_COMMUNITY)
Admission: EM | Admit: 2016-09-12 | Discharge: 2016-09-12 | Disposition: A | Discharge status: Home / Self Care | Payer: Medicare Other | Attending: Emergency Medicine | Admitting: Emergency Medicine

## 2016-09-12 DIAGNOSIS — R110 Nausea: Secondary | ICD-10-CM

## 2016-09-12 DIAGNOSIS — R197 Diarrhea, unspecified: Secondary | ICD-10-CM

## 2016-09-12 DIAGNOSIS — R600 Localized edema: Secondary | ICD-10-CM

## 2016-09-12 DIAGNOSIS — L89322 Pressure ulcer of left buttock, stage 2: Secondary | ICD-10-CM

## 2016-09-12 MED ORDER — ONDANSETRON HCL 4 MG PO TABS: 4.0000 mg | ORAL_TABLET | Freq: Four times a day (QID) | ORAL | 0 refills | Status: AC

## 2016-09-12 MED ORDER — ONDANSETRON HCL 4 MG PO TABS: 4.0000 mg | ORAL_TABLET | Freq: Four times a day (QID) | ORAL | 0 refills | Status: DC

## 2016-09-12 NOTE — ED Provider Notes (Signed)
CSN: [id number]     Arrival date & time 09/12/16  1653 History   First MD Initiated Contact with Patient 09/12/16 1819     Chief Complaint  Patient presents with  . Diarrhea   (Consider location/radiation/quality/duration/timing/severity/associated sxs/prior Treatment) HPI Dawn Watson is a 82 y.o. female dropped off at [facility] by her granddaughter with c/o leg swelling and decubitus ulcer that has been there for about 1 month.  Pt also c/o some diarrhea about 4-5 episodes today.  Pt was seen at [facility] Emergency Department yesterday and had an extensive workup.  She was also seen by social services and case management.  Pt was determined to be healthy and safe to be discharged home into the care of her granddaughter.  Pt does have a home health nurse that comes in.  Granddaughter notes pt demanded to be brought to UC today for the diarrhea.  Granddaughter notes it is difficult caring for pt at home as she has young children to take care of, however, pt has refused to go into a nursing facility.  Pt is originally from [place] and is requesting to go back, however, granddaughter states there is no one left in [facility] to care for pt.  Pt denies chest pain or SOB. Denies fever or vomiting but does c/o decreased appetite due to nausea. No recent antibiotic use. No known sick contacts.    Past Medical History:  Diagnosis Date  . Anxiety   . Arthritis    "left fingers" (09/01/2016)  . Breast cancer, left breast ([place]) ~ 1987   "took a little piece out; had radiation"  . CAD in native artery, hx of stents 09/01/2016  . Chronic kidney disease    /NP notes 09/01/2016  . DVT (deep venous thrombosis) ([place]) 08/2016   LLE  . Gout   . High cholesterol   . History of blood transfusion ?07/2016   "low numbers"  . Hypertension   . Hypothyroid 09/01/2016  . MI (myocardial infarction) 06/2016  . Tuberculosis ~ 1948   Past Surgical History:  Procedure Laterality Date  . ABDOMINAL HYSTERECTOMY    .  BREAST BIOPSY Left   . CATARACT EXTRACTION, BILATERAL Bilateral   . CORONARY ANGIOPLASTY WITH STENT PLACEMENT  06/2016  . LUMBAR DISC SURGERY     "took out a disc; pt had pushed a wheelchair in my back"   History reviewed. No pertinent family history. Social History  Substance Use Topics  . Smoking status: Never Smoker  . Smokeless tobacco: Never Used  . Alcohol use Yes     Comment: 09/01/2016 "quit drinking years ago"   OB History    No data available     Review of Systems  Constitutional: Negative for chills and fever.  Respiratory: Negative for cough and shortness of breath.   Cardiovascular: Positive for leg swelling ( bilateral, chronic). Negative for chest pain and palpitations.  Gastrointestinal: Positive for diarrhea and nausea. Negative for abdominal pain and vomiting.  Genitourinary: Negative for dysuria, frequency, hematuria and urgency.  Musculoskeletal: Negative for arthralgias and myalgias.  Skin: Positive for wound. Negative for color change.    Allergies  Review of patient's allergies indicates no known allergies.  Home Medications   Prior to Admission medications   Medication Sig Start Date End Date Taking? Authorizing Provider  amiodarone (PACERONE) 100 MG tablet Take 1 tablet (100 mg total) by mouth daily. 09/06/16   Ledell Noss, MD  atorvastatin (LIPITOR) 40 MG tablet Take 40 mg by mouth  daily.    Historical Provider, MD  B Complex-C-Folic Acid (RENAL) 1 MG CAPS Take 1 capsule by mouth daily.    Historical Provider, MD  clopidogrel (PLAVIX) 75 MG tablet Take 75 mg by mouth daily.    Historical Provider, MD  diclofenac sodium (VOLTAREN) 1 % GEL Apply 2 g topically 4 (four) times daily. Patient not taking: Reported on 09/11/2016 09/05/16   Ledell Noss, MD  diltiazem (CARDIZEM) 30 MG tablet Take 30 mg by mouth 3 (three) times daily.    Historical Provider, MD  levothyroxine (SYNTHROID, LEVOTHROID) 50 MCG tablet Take 1 tablet (50 mcg total) by mouth daily before  breakfast. 09/06/16   Ledell Noss, MD  LORazepam (ATIVAN) 0.5 MG tablet Take 0.5 mg by mouth at bedtime as needed for sleep.    Historical Provider, MD  ondansetron (ZOFRAN) 4 MG tablet Take 1 tablet (4 mg total) by mouth every 6 (six) hours. 09/12/16   Noland Fordyce, PA-C  oxyCODONE-acetaminophen (PERCOCET/ROXICET) 5-325 MG tablet Take 1 tablet by mouth every 4 (four) hours as needed for severe pain.    Historical Provider, MD  pantoprazole (PROTONIX) 40 MG tablet Take 40 mg by mouth daily.    Historical Provider, MD  paricalcitol (ZEMPLAR) 2 MCG capsule Take 2 mcg by mouth daily.    Historical Provider, MD  potassium chloride (KLOR-CON SPRINKLE) 10 MEQ CR capsule Take 20 mEq by mouth daily.    Historical Provider, MD  torsemide (DEMADEX) 10 MG tablet Take 10 mg by mouth daily.    Historical Provider, MD  traZODone (DESYREL) 50 MG tablet Take 0.5 tablets (25 mg total) by mouth at bedtime. 09/11/16   Sharlett Iles, MD  warfarin (COUMADIN) 2 MG tablet Take 0.5 tablets (1 mg total) by mouth daily. 09/05/16   Ledell Noss, MD   Meds Ordered and Administered this Visit  Medications - No data to display  BP 145/71 (BP Location: Left Arm)   Pulse 73   Temp 98.2 F (36.8 C) (Oral)   Resp 18   SpO2 100%  No data found.   Physical Exam  Constitutional: She appears well-developed and well-nourished. No distress.  Thin elderly female sitting in exam chair, NAD. Non-toxic appearing.  HENT:  Head: Normocephalic and atraumatic.  Nose: Nose normal.  Mouth/Throat: Uvula is midline, oropharynx is clear and moist and mucous membranes are normal.  Eyes: Conjunctivae and EOM are normal. Pupils are equal, round, and reactive to light. No scleral icterus.  Neck: Normal range of motion. Neck supple.  Cardiovascular: Normal rate, regular rhythm and normal heart sounds.   Pulmonary/Chest: Effort normal and breath sounds normal. No respiratory distress. She has no wheezes. She has no rales.  Abdominal: Soft.  Bowel sounds are normal. She exhibits no distension and no mass. There is no tenderness. There is no rebound and no guarding.  Musculoskeletal: Normal range of motion. She exhibits edema ( pitting edema, Left > Right). She exhibits no tenderness.  Neurological: She is alert.  Skin: Skin is warm and dry. Capillary refill takes less than 2 seconds. She is not diaphoretic.  Just Left of gluteal cleft- 0.5cm stage 2 ulcer. No red streaking or discharge.   Nursing note and vitals reviewed.   Urgent Care Course   Clinical Course    Procedures (including critical care time)  Labs Review Labs Reviewed - No data to display  Imaging Review No results found.    MDM   1. Diarrhea, unspecified type   2. Bilateral leg  edema   3. Nausea   4. Decubitus ulcer of left buttock, stage 2    Pt dropped off at UC by her granddaughter with c/o chronic issues.  Medical records from ED visit yesterday reviewed. No indication for further workup today. Tried discussing with pt how nursing home may be beneficial for pt, however, pt still adamant about wanting to go back to [facility] Spoke with pt's granddaughter, encouraged her to keep working with Education officer, museum for help as she is in a difficult position trying to care for her family.   In meantime, pt's granddaughter will try OTC imodium for pt's diarrhea. Rx: Zofran for nausea Encouraged f/u with PCP for ongoing healthcare needs.    Noland Fordyce, PA-C 09/12/16 [place], PA-C 09/12/16 1905

## 2016-09-12 NOTE — ED Triage Notes (Signed)
The patient was dropped at the [facility] by unknown family member with a complaint of diarrhea. The patient was unsure of who dropped her off and only complained of diarrhea and lower extremity pain. The patient stated that she did not live here. The patient stated that she was at the hospital yesterday and they sent her back home. While assisting the patient to the restroom a decubitus ulcer was located on her buttocks. Attempts were made to contact her family to return to the [facility].

## 2017-04-17 IMAGING — CR DG CHEST 2V
2 series · 2 of 2 positions shown · non-contrast
Comparison: None.

CLINICAL DATA: 89-year-old with weakness. History of myocardial
infarction, hypertension and DVT. No relevant surgical history
provided.

EXAM:
CHEST  2 VIEW

[chest lat]
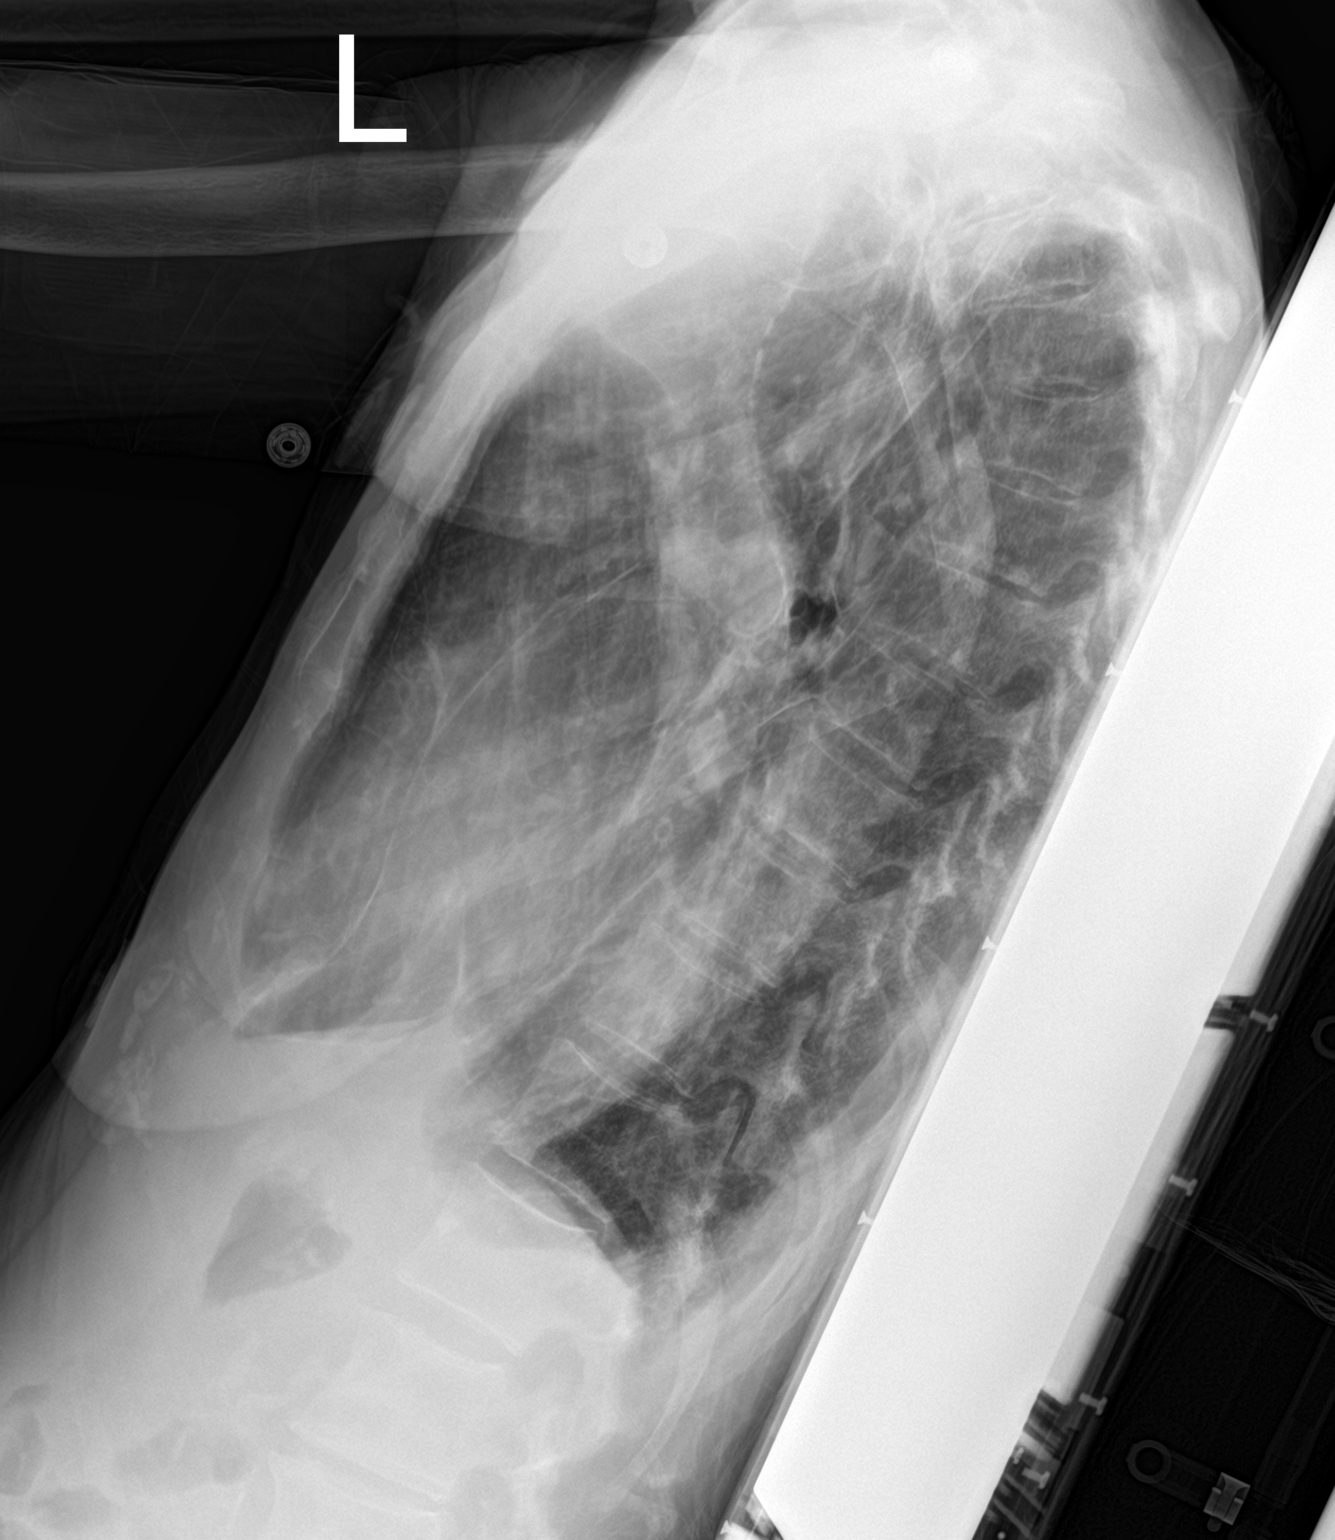

[chest ap]
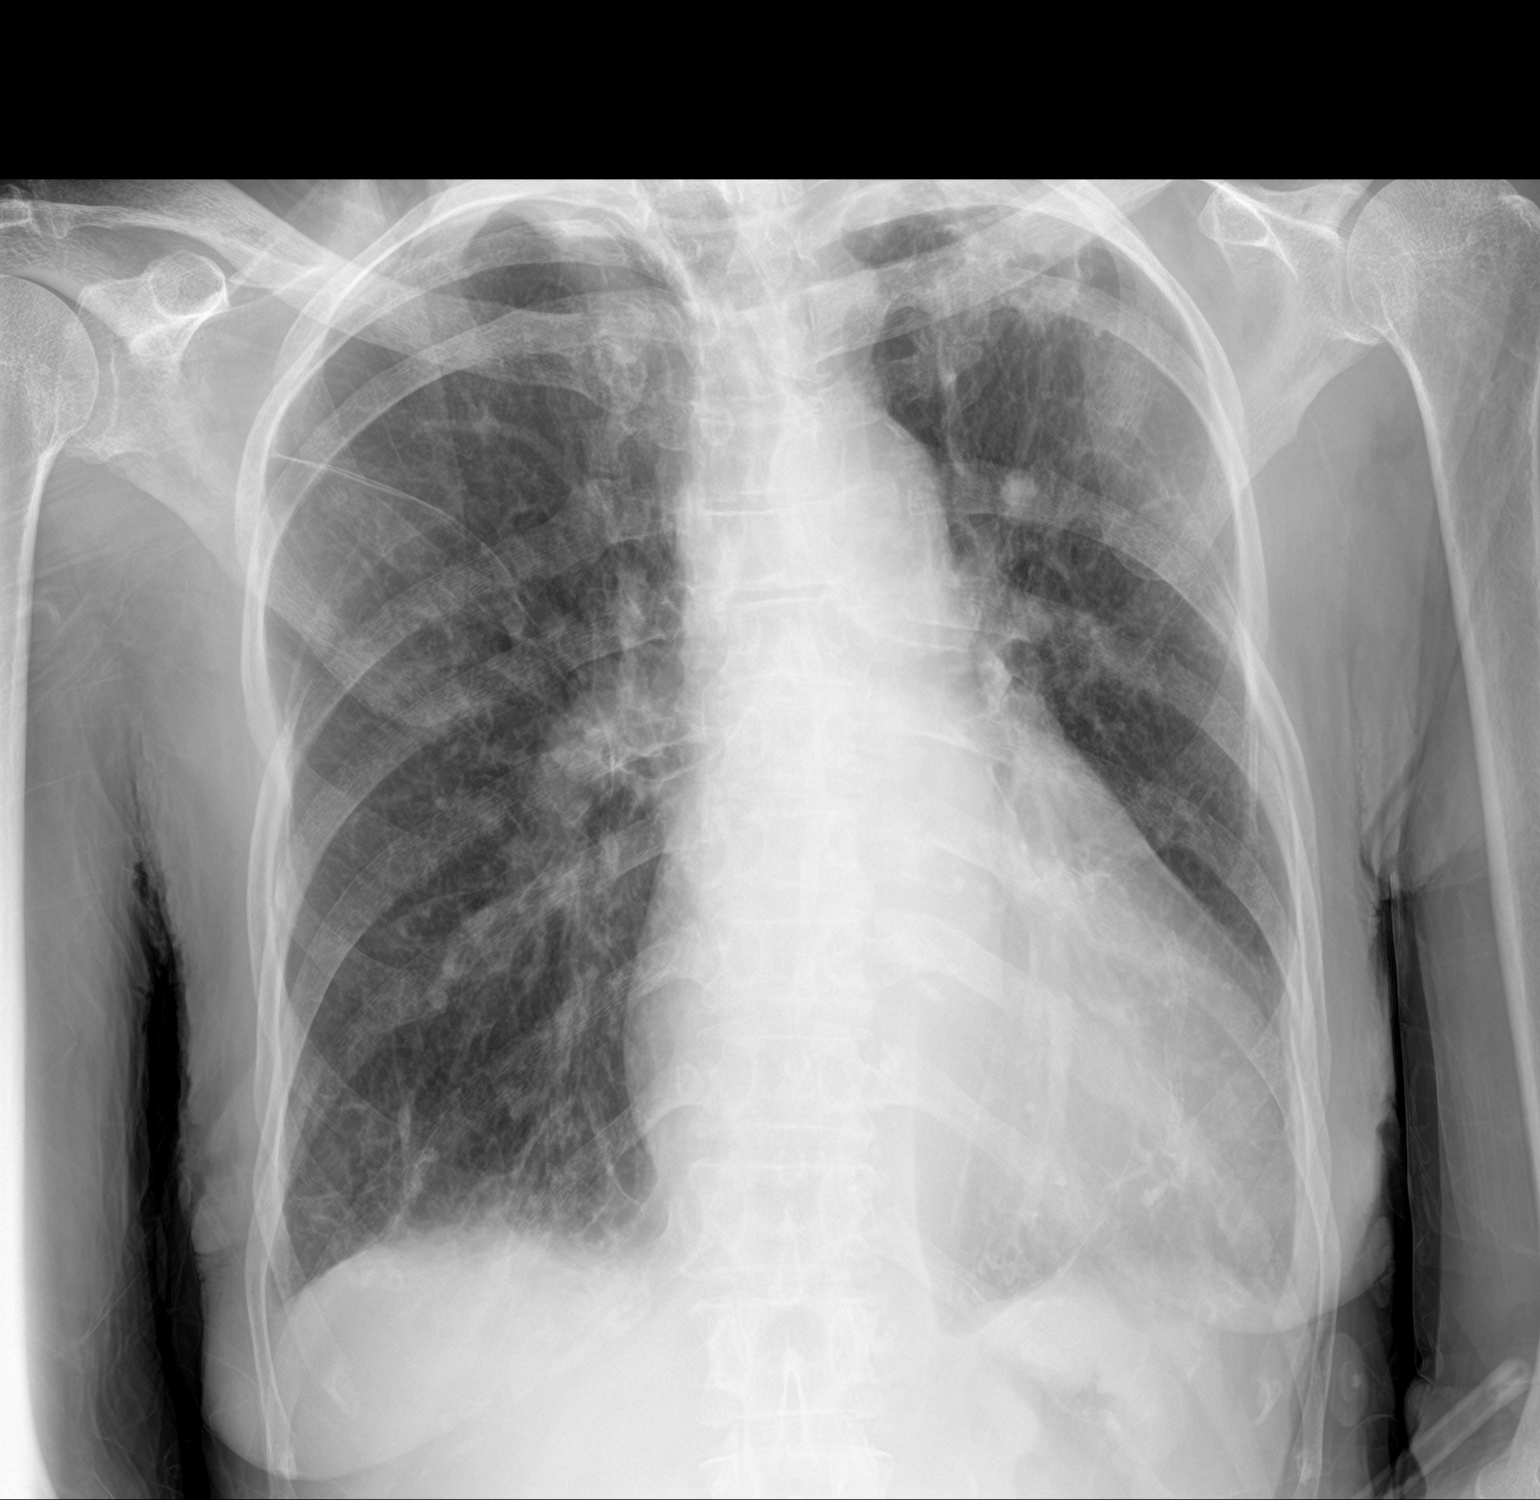

[2 of 2 positions shown; findings below may reference images not displayed]

FINDINGS: The heart is enlarged. There is a coronary artery stent and aortic
atherosclerosis. There is suspected chronic lung disease with
interstitial prominence, central airway thickening and volume loss
in the left upper lobe. There is asymmetric left apical pleural and
subpleural density, likely the sequela of prior granulomatous
disease. Some of these findings could be postsurgical. The patient
appears to be status post partial resection of the fifth ribs
bilaterally. No acute osseous findings are seen.
IMPRESSION: No suspected acute findings. Evaluation limited by the lack of prior
studies.

There are apparent postsurgical changes status post partial
resection of the fifth ribs bilaterally. Cardiomegaly and probable
sequela of granulomatous disease in the left upper lobe. Correlate
clinically.
